# Patient Record
Sex: Male | Born: 1960 | Race: White | Hispanic: Yes | Marital: Married | Smoking: Never smoker
Health system: Southern US, Community
[De-identification: ages and names within clinical notes are randomized; demographics above are authoritative.]

## PROBLEM LIST (undated history)

## (undated) DIAGNOSIS — D239 Other benign neoplasm of skin, unspecified: Secondary | ICD-10-CM

## (undated) DIAGNOSIS — K5792 Diverticulitis of intestine, part unspecified, without perforation or abscess without bleeding: Secondary | ICD-10-CM

## (undated) DIAGNOSIS — Z86010 Personal history of colonic polyps: Secondary | ICD-10-CM

## (undated) DIAGNOSIS — B029 Zoster without complications: Secondary | ICD-10-CM

## (undated) DIAGNOSIS — IMO0001 Reserved for inherently not codable concepts without codable children: Secondary | ICD-10-CM

## (undated) DIAGNOSIS — H3320 Serous retinal detachment, unspecified eye: Secondary | ICD-10-CM

## (undated) HISTORY — DX: Other benign neoplasm of skin, unspecified: D23.9

## (undated) HISTORY — PX: EYE SURGERY: SHX253

## (undated) HISTORY — DX: Serous retinal detachment, unspecified eye: H33.20

## (undated) HISTORY — DX: Diverticulitis of intestine, part unspecified, without perforation or abscess without bleeding: K57.92

## (undated) HISTORY — DX: Personal history of colonic polyps: Z86.010

## (undated) HISTORY — DX: Zoster without complications: B02.9

## (undated) HISTORY — DX: Reserved for inherently not codable concepts without codable children: IMO0001

---

## 2009-08-05 ENCOUNTER — Ambulatory Visit: Admission: RE | Admit: 2009-08-05 | Discharge: 2009-08-05 | Payer: Self-pay | Source: Home / Self Care

## 2010-07-15 ENCOUNTER — Encounter: Payer: Self-pay | Admitting: Internal Medicine

## 2010-07-15 ENCOUNTER — Ambulatory Visit: Payer: Self-pay | Admitting: Internal Medicine

## 2010-07-15 DIAGNOSIS — D239 Other benign neoplasm of skin, unspecified: Secondary | ICD-10-CM | POA: Insufficient documentation

## 2010-07-20 DIAGNOSIS — D239 Other benign neoplasm of skin, unspecified: Secondary | ICD-10-CM

## 2010-07-20 DIAGNOSIS — IMO0001 Reserved for inherently not codable concepts without codable children: Secondary | ICD-10-CM

## 2010-07-20 HISTORY — DX: Other benign neoplasm of skin, unspecified: D23.9

## 2010-07-20 HISTORY — DX: Reserved for inherently not codable concepts without codable children: IMO0001

## 2010-07-23 ENCOUNTER — Ambulatory Visit
Admission: RE | Admit: 2010-07-23 | Discharge: 2010-07-23 | Payer: Self-pay | Source: Home / Self Care | Attending: Internal Medicine | Admitting: Internal Medicine

## 2010-07-23 ENCOUNTER — Other Ambulatory Visit: Payer: Self-pay | Admitting: Internal Medicine

## 2010-07-23 LAB — CBC WITH DIFFERENTIAL/PLATELET
Basophils Absolute: 0 10*3/uL (ref 0.0–0.1)
Basophils Relative: 0.6 % (ref 0.0–3.0)
Eosinophils Absolute: 0.1 10*3/uL (ref 0.0–0.7)
Eosinophils Relative: 1.9 % (ref 0.0–5.0)
HCT: 44.7 % (ref 39.0–52.0)
Hemoglobin: 15.2 g/dL (ref 13.0–17.0)
Lymphocytes Relative: 23.5 % (ref 12.0–46.0)
Lymphs Abs: 1.3 10*3/uL (ref 0.7–4.0)
MCHC: 34.1 g/dL (ref 30.0–36.0)
MCV: 92.3 fl (ref 78.0–100.0)
Monocytes Absolute: 0.5 10*3/uL (ref 0.1–1.0)
Monocytes Relative: 9.2 % (ref 3.0–12.0)
Neutro Abs: 3.6 10*3/uL (ref 1.4–7.7)
Neutrophils Relative %: 64.8 % (ref 43.0–77.0)
Platelets: 130 10*3/uL — ABNORMAL LOW (ref 150.0–400.0)
RBC: 4.84 Mil/uL (ref 4.22–5.81)
RDW: 13.8 % (ref 11.5–14.6)
WBC: 5.6 10*3/uL (ref 4.5–10.5)

## 2010-07-23 LAB — BASIC METABOLIC PANEL
BUN: 21 mg/dL (ref 6–23)
CO2: 29 mEq/L (ref 19–32)
Calcium: 9.6 mg/dL (ref 8.4–10.5)
Chloride: 103 mEq/L (ref 96–112)
Creatinine, Ser: 1.2 mg/dL (ref 0.4–1.5)
GFR: 69.03 mL/min (ref >60.00)
Glucose, Bld: 95 mg/dL (ref 70–99)
Potassium: 4.7 mEq/L (ref 3.5–5.1)
Sodium: 139 mEq/L (ref 135–145)

## 2010-07-23 LAB — LIPID PANEL
Cholesterol: 177 mg/dL (ref 0–200)
HDL: 40.7 mg/dL (ref >39.00)
LDL Cholesterol: 117 mg/dL — ABNORMAL HIGH (ref 0–99)
Total CHOL/HDL Ratio: 4
Triglycerides: 95 mg/dL (ref 0.0–149.0)
VLDL: 19 mg/dL (ref 0.0–40.0)

## 2010-07-23 LAB — TSH: TSH: 2.17 u[IU]/mL (ref 0.35–5.50)

## 2010-07-23 LAB — PSA: PSA: 1.63 ng/mL (ref 0.10–4.00)

## 2010-07-23 LAB — ALT: ALT: 34 U/L (ref 0–53)

## 2010-07-23 LAB — AST: AST: 53 U/L — ABNORMAL HIGH (ref 0–37)

## 2010-07-24 ENCOUNTER — Ambulatory Visit: Admission: RE | Admit: 2010-07-24 | Discharge: 2010-07-24 | Payer: Self-pay | Source: Home / Self Care

## 2010-07-24 DIAGNOSIS — R9430 Abnormal result of cardiovascular function study, unspecified: Secondary | ICD-10-CM | POA: Insufficient documentation

## 2010-08-04 ENCOUNTER — Telehealth (INDEPENDENT_AMBULATORY_CARE_PROVIDER_SITE_OTHER): Payer: Self-pay | Admitting: *Deleted

## 2010-08-05 ENCOUNTER — Ambulatory Visit (HOSPITAL_COMMUNITY)
Admission: RE | Admit: 2010-08-05 | Discharge: 2010-08-05 | Payer: Self-pay | Source: Home / Self Care | Attending: Cardiology | Admitting: Cardiology

## 2010-08-05 ENCOUNTER — Encounter: Payer: Self-pay | Admitting: Cardiology

## 2010-08-05 ENCOUNTER — Encounter: Payer: Self-pay | Admitting: Physician Assistant

## 2010-08-05 ENCOUNTER — Encounter (HOSPITAL_COMMUNITY)
Admission: RE | Admit: 2010-08-05 | Discharge: 2010-08-05 | Payer: Self-pay | Source: Home / Self Care | Attending: Cardiology | Admitting: Cardiology

## 2010-08-05 ENCOUNTER — Ambulatory Visit: Admit: 2010-08-05 | Payer: Self-pay

## 2010-08-12 ENCOUNTER — Encounter: Payer: Self-pay | Admitting: Internal Medicine

## 2010-08-21 NOTE — Assessment & Plan Note (Signed)
Summary: NEW TO EST/KN   Vital Signs:  Patient profile:   50 year old male Height:      68.25 inches Weight:      187.25 pounds BMI:     28.37 Pulse rate:   69 / minute Pulse rhythm:   regular BP sitting:   120 / 80  (left arm) Cuff size:   regular  Vitals Entered By: Army Fossa CMA (July 15, 2010 2:56 PM) CC: New to establish- CPX, not fasting    History of Present Illness: CPX last CPX  ~ 2007, cholesterol has been checked yearly ----> normal, neg stool test before  would like stress test, recently his brother who is very athletic, had an abnormal stress test , follow up by a cath---> 2  stents  Preventive Screening-Counseling & Management  Alcohol-Tobacco     Smoking Status: never  Caffeine-Diet-Exercise     Does Patient Exercise: yes     Times/week: 3      Drug Use:  no.    Current Medications (verified): 1)  None  Allergies (verified): 1)  ! Pcn  Past History:  Family History: Last updated: 07/15/2010 Hypertension-- B  DM--no CAD-- MI GF, 3 uncles ; B  (stent  at age 13) lung cancer-- father (smoker) colon ca--no colon polyps--no prostate ca--no  Social History: Last updated: 07/15/2010 Married 3 children  Never Smoked Alcohol use-yes Drug use-no Regular exercise--yes, runner, regular  diet--regular   Risk Factors: Exercise: yes (07/15/2010)  Risk Factors: Smoking Status: never (07/15/2010)  Past Medical History: duodenal ulcer twice in his 30s  Past Surgical History: Eye Surgery -- x 2   Family History: Hypertension-- B  DM--no CAD-- MI GF, 3 uncles ; B  (stent  at age 54) lung cancer-- father (smoker) colon ca--no colon polyps--no prostate ca--no  Social History: Married 3 children  Never Smoked Alcohol use-yes Drug use-no Regular exercise--yes, runner, regular  diet--regular  Smoking Status:  never Drug Use:  no Does Patient Exercise:  yes  Review of Systems CV:  Denies chest pain or discomfort and  swelling of feet. Resp:  Denies cough and shortness of breath. GI:  Denies bloody stools, diarrhea, nausea, and vomiting. GU:  Denies hematuria, urinary frequency, and urinary hesitancy.  Physical Exam  General:  alert, well-developed, and well-nourished.   Neck:  no masses, no thyromegaly, and normal carotid upstroke.   Lungs:  normal respiratory effort, no intercostal retractions, no accessory muscle use, and normal breath sounds.   Heart:  normal rate, regular rhythm, and no murmur.   Abdomen:  soft, non-tender, no distention, no masses, no guarding, and no rigidity.   Rectal:  No external abnormalities noted. Normal sphincter tone. No rectal masses or tenderness. hemocult neg Prostate:  Prostate gland firm and smooth, no enlargement, nodularity, tenderness, mass, asymmetry or induration. Extremities:  no pretibial edema bilaterally  Psych:  Cognition and judgment appear intact. Alert and cooperative with normal attention span and concentration.    Impression & Recommendations:  Problem # 1:  ROUTINE GENERAL MEDICAL EXAM@HEALTH  CARE FACL (ICD-V70.0) Td  ~ 2007 flu shot-- recommended if so desire  labs ++ FH CAD--- refer to stress test multiple moles -- one of them bicolor at the lower back, refer to dermatology   continue w/  exercise diet discussed   Orders: EKG w/ Interpretation (93000) Cardiology Referral (Cardiology)  Other Orders: Dermatology Referral (Derma)  Patient Instructions: 1)  came back fasting: 2)  FLP PSA BMP CBC AST ALT  TSH----dx V70 3)  Please schedule a follow-up appointment in 1 year.    Orders Added: 1)  EKG w/ Interpretation [93000] 2)  Cardiology Referral [Cardiology] 3)  Dermatology Referral [Derma] 4)  New Patient 40-64 years [99386]   Immunization History:  Tetanus/Td Immunization History:    Tetanus/Td:  historical (07/20/2005)   Immunization History:  Tetanus/Td Immunization History:    Tetanus/Td:  Historical  (07/20/2005)   Risk Factors:  Tobacco use:  never Drug use:  no Exercise:  yes    Times per week:  3

## 2010-08-21 NOTE — Progress Notes (Signed)
Summary: Stress Echo pre procedure  Phone Note Outgoing Call Call back at [facility] Phone [phone]   Call placed by: Rea College, CMA,  August 04, 2010 3:40 PM Call placed to: Patient Summary of Call: Called and left message for stress echo on Tuesday, 08/05/10.

## 2010-09-04 NOTE — Consult Note (Signed)
Summary: [facility]  [facility]   Imported By: Maryln Gottron 08/26/2010 09:39:37  _____________________________________________________________________  External Attachment:    Type:   Image     Comment:   External Document

## 2010-12-26 ENCOUNTER — Ambulatory Visit: Payer: Self-pay | Admitting: Internal Medicine

## 2010-12-26 ENCOUNTER — Encounter: Payer: Self-pay | Admitting: Internal Medicine

## 2010-12-26 ENCOUNTER — Ambulatory Visit (INDEPENDENT_AMBULATORY_CARE_PROVIDER_SITE_OTHER): Payer: BC Managed Care – PPO | Admitting: Internal Medicine

## 2010-12-26 DIAGNOSIS — M549 Dorsalgia, unspecified: Secondary | ICD-10-CM | POA: Insufficient documentation

## 2010-12-26 MED ORDER — CYCLOBENZAPRINE HCL 10 MG PO TABS: 10.0000 mg | ORAL_TABLET | Freq: Every evening | ORAL | Status: DC | PRN

## 2010-12-26 MED ORDER — PREDNISONE 10 MG PO TABS: ORAL_TABLET | ORAL | Status: AC

## 2010-12-26 MED ORDER — HYDROCODONE-ACETAMINOPHEN 5-500 MG PO TABS: 1.0000 | ORAL_TABLET | ORAL | Status: DC | PRN

## 2010-12-26 NOTE — Patient Instructions (Signed)
Rest, warm compress Prednisone for few days Tylenol 500 mg 2 tabs every 6 hours as needed for pain Flexeril at bedtime (muscle relaxant) If the pain is severe : hydrocodone, will cause drowsiness  Physical Therapy @ [facility] [phone] Call if no better

## 2010-12-26 NOTE — Assessment & Plan Note (Signed)
Several years history of on and off back pain, most recent exacerbation started 2 weeks ago. Neurological exam normal. Short-term treatment: Prednisone, Flexeril, Tylenol and Vicodin. Because he is a history of peptic ulcer disease, I prefer to avoid Celebrex. I also recommend to stop Motrin. Long-term solution, I think he will benefit from physical therapy and strengthening on his core muscles.Due to to his heavy schedule I will ask him to call them directly. Possible drowsiness with Flexeril and Vicodin discussed

## 2010-12-26 NOTE — Progress Notes (Signed)
  Subjective:    Patient ID: Gary Huff, male    DOB: 25-Mar-1961, 50 y.o.   MRN: [id number]  HPI  Long history of on-and-off back pain since he was a teenager. At some point years ago he had an MRI ---> was okay. Vioxx use to help him a lot. In the last few years, he has seen a chiropractor on and off, he has occasionally a back adjustment. This particular episode started 3 weeks ago, pain is at the bilateral lower back without radiation. 3 days ago after a soccer match he felt  worse. Went to see his chiropractor, he received an adjustment yesterday and was recommended Motrin and ice. Motrin helped some.  Past Medical History  Diagnosis Date  . Duodenal ulcer     twice in his 30s  . Normal stress echocardiogram 07/2010    no ischemia; normal LVF  . Dysplastic nevus 07-2010    Dr Terri Piedra   Past Surgical History  Procedure Date  . Eye surgery     x 2     Review of Systems No fevers No bowel or bladder incontinence, nor rash. No lower extremity paresthesias. No abdominal pain or dysuria      Objective:   Physical Exam Alert, oriented, no apparent distress except for mild discomfort when he laid down in the table to be examined. Abdomen soft and nontender. Back, not tender to palpation. Neurological exam strength in the lower extremities is normal DTRs normal except for symmetric decrease in the ankle jerk. Straight leg test negative. Lower extremity without edema.       Assessment & Plan:

## 2010-12-29 ENCOUNTER — Ambulatory Visit: Payer: Self-pay | Admitting: Internal Medicine

## 2011-07-03 ENCOUNTER — Ambulatory Visit (INDEPENDENT_AMBULATORY_CARE_PROVIDER_SITE_OTHER): Payer: BC Managed Care – PPO | Admitting: Internal Medicine

## 2011-07-03 VITALS — BP 118/70 | HR 61 | Temp 98.0°F | Ht 69.5 in | Wt 184.0 lb

## 2011-07-03 DIAGNOSIS — J069 Acute upper respiratory infection, unspecified: Secondary | ICD-10-CM

## 2011-07-03 MED ORDER — AZITHROMYCIN 250 MG PO TABS: ORAL_TABLET | ORAL | Status: AC

## 2011-07-03 NOTE — Patient Instructions (Signed)
Rest, fluids , tylenol For cough, take Mucinex DM twice a day as needed  For congestion use astepro 2 sprays on each side of the nose at night until samples are gone A;s you can use Sudafed (pseudoephedrine) behind the counter 30 mg every  6 hours as needed If no better in the next 2 ,3 days start the the antibiotic as prescribed ----> zithromax  Call if no better in few days Call anytime if the symptoms are severe

## 2011-07-03 NOTE — Progress Notes (Signed)
  Subjective:    Patient ID: Gary Huff, male    DOB: 1961-06-06, 50 y.o.   MRN: [id number]  HPI Sx started 1 weeks ago: sinus congestion and PND, OTC helping little. X the last day, "sx going to the chest"  Past Medical History: duodenal ulcer twice in his 30s Normal stress echocardiogram 07/2010 , no ischemia; normal LVF  Dysplastic nevus , 07-2010 , Dr Terri Piedra   Past Surgical History: Eye Surgery -- x 2   Family History: Hypertension-- B  DM--no CAD-- MI GF, 3 uncles ; B  (stent  at age 68) lung cancer-- father (smoker) colon ca--no colon polyps--no prostate ca--no  Social History: Married 3 children  Never Smoked Alcohol use-yes Drug use-no Regular exercise--yes, runner, regular  diet--regular  Smoking Status:  never Drug Use:  no Does Patient Exercise:  yes  Review of Systems no F/C, no N-V-Dno myalgias Some cough ++ PND, clear in color       Objective:   Physical Exam  Constitutional: He is oriented to person, place, and time. He appears well-developed and well-nourished. No distress.  HENT:  Head: Normocephalic and atraumatic.  Right Ear: External ear normal.  Left Ear: External ear normal.  Mouth/Throat: No oropharyngeal exudate.       Face symetric , NTTP, nose quite congested   Cardiovascular: Normal rate, regular rhythm and normal heart sounds.   No murmur heard. Pulmonary/Chest: Effort normal and breath sounds normal. No respiratory distress. He has no wheezes. He has no rales.  Musculoskeletal: He exhibits no edema.  Neurological: He is alert and oriented to person, place, and time.  Skin: He is not diaphoretic.      Assessment & Plan:  URI See instructions

## 2011-07-23 ENCOUNTER — Ambulatory Visit (INDEPENDENT_AMBULATORY_CARE_PROVIDER_SITE_OTHER): Payer: BC Managed Care – PPO | Admitting: Internal Medicine

## 2011-07-23 ENCOUNTER — Encounter: Payer: Self-pay | Admitting: Internal Medicine

## 2011-07-23 DIAGNOSIS — Z0000 Encounter for general adult medical examination without abnormal findings: Secondary | ICD-10-CM

## 2011-07-23 LAB — CBC WITH DIFFERENTIAL/PLATELET
Eosinophils Relative: 1.6 % (ref 0.0–5.0)
HCT: 44.6 % (ref 39.0–52.0)
Lymphs Abs: 1.3 10*3/uL (ref 0.7–4.0)
Monocytes Relative: 9.8 % (ref 3.0–12.0)
Platelets: 153 10*3/uL (ref 150.0–400.0)
WBC: 4.6 10*3/uL (ref 4.5–10.5)

## 2011-07-23 LAB — COMPREHENSIVE METABOLIC PANEL
AST: 31 U/L (ref 0–37)
Alkaline Phosphatase: 44 U/L (ref 39–117)
BUN: 18 mg/dL (ref 6–23)
Calcium: 9.7 mg/dL (ref 8.4–10.5)
Creatinine, Ser: 1 mg/dL (ref 0.4–1.5)
Total Bilirubin: 1 mg/dL (ref 0.3–1.2)

## 2011-07-23 LAB — PSA: PSA: 1.66 ng/mL (ref 0.10–4.00)

## 2011-07-23 NOTE — Assessment & Plan Note (Addendum)
Td ~ 2007 Had a flu shot labs ++ FH CAD---> NMR  multiple moles -- rec to see Dr Terri Piedra at least anually Discussed cscope v.iFOB, (risk-benefits), decide to go for a Cscope---> referral continue w/  exercise diet discussed

## 2011-07-23 NOTE — Progress Notes (Signed)
  Subjective:    Patient ID: Gary Huff, male    DOB: 1960/11/26, 51 y.o.   MRN: [id number]  HPI CPX  Past Medical History: duodenal ulcer twice in his 30s Dysplastic nevus, 07-2010, Dr. 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  Respiratory: Negative for cough and shortness of breath.   Cardiovascular: Negative for chest pain and leg swelling.  Gastrointestinal: Negative for abdominal pain and blood in stool.  Genitourinary: Negative for dysuria and hematuria.       Objective:   Physical Exam  Constitutional: He is oriented to person, place, and time. He appears well-developed and well-nourished. No distress.  HENT:  Head: Normocephalic and atraumatic.  Neck: No thyromegaly present.  Cardiovascular: Normal rate, regular rhythm and normal heart sounds.   No murmur heard. Pulmonary/Chest: Effort normal and breath sounds normal. No respiratory distress. He has no wheezes. He has no rales.  Abdominal: Soft. Bowel sounds are normal. He exhibits no distension. There is no tenderness. There is no rebound and no guarding.  Genitourinary: Rectum normal and prostate normal. Guaiac negative stool.  Musculoskeletal: He exhibits no edema.  Lymphadenopathy:    He has no cervical adenopathy.  Neurological: He is alert and oriented to person, place, and time.  Skin: He is not diaphoretic.  Psychiatric: He has a normal mood and affect. His behavior is normal. Judgment and thought content normal.       Assessment & Plan:

## 2011-07-24 ENCOUNTER — Encounter: Payer: Self-pay | Admitting: Internal Medicine

## 2011-07-24 LAB — NMR, LIPOPROFILE

## 2011-08-05 ENCOUNTER — Ambulatory Visit: Payer: BC Managed Care – PPO | Admitting: *Deleted

## 2011-08-05 NOTE — Progress Notes (Signed)
Spoke with pt's wife at 1500- she gave his cell phone number.  Message left on cell for pt to call office back to reschedule PV; otherwise colonoscopy for 08-19-11 would be canceled   No call from pt- letter sent and colonoscopy for 08-19-11 cancelled

## 2011-08-07 ENCOUNTER — Telehealth: Payer: Self-pay | Admitting: Internal Medicine

## 2011-08-07 NOTE — Telephone Encounter (Signed)
Labs reviewed, all normal.  NMR: Total cholesterol 179, HDL 53, LDL 105. LDL/P. borderline high at 1610 Given the strong family history of heart disease I would like to see the LDL below 100 and LDL/P lower LMOM

## 2011-08-18 MED ORDER — ATORVASTATIN CALCIUM 10 MG PO TABS: 10.0000 mg | ORAL_TABLET | Freq: Every day | ORAL | Status: DC

## 2011-08-18 NOTE — Telephone Encounter (Signed)
I discussed the results of his labs  at length during a phone conversation a few days ago. sending a prescription for Lipitor to his pharmacy Also recommended to have FLP, AST, ALT in 6 weeks, dx hyperlipidemia

## 2011-08-19 ENCOUNTER — Encounter: Payer: BC Managed Care – PPO | Admitting: Internal Medicine

## 2011-08-21 ENCOUNTER — Encounter: Payer: Self-pay | Admitting: Internal Medicine

## 2012-04-23 ENCOUNTER — Other Ambulatory Visit: Payer: Self-pay | Admitting: Internal Medicine

## 2012-04-25 NOTE — Telephone Encounter (Signed)
Refill done.  

## 2012-05-12 ENCOUNTER — Ambulatory Visit (HOSPITAL_BASED_OUTPATIENT_CLINIC_OR_DEPARTMENT_OTHER)
Admission: RE | Admit: 2012-05-12 | Discharge: 2012-05-12 | Disposition: A | Discharge status: Home / Self Care | Payer: BC Managed Care – PPO | Source: Ambulatory Visit | Attending: Internal Medicine | Admitting: Internal Medicine

## 2012-05-12 ENCOUNTER — Encounter (HOSPITAL_BASED_OUTPATIENT_CLINIC_OR_DEPARTMENT_OTHER): Payer: Self-pay

## 2012-05-12 ENCOUNTER — Ambulatory Visit (INDEPENDENT_AMBULATORY_CARE_PROVIDER_SITE_OTHER): Payer: BC Managed Care – PPO | Admitting: Internal Medicine

## 2012-05-12 ENCOUNTER — Encounter: Payer: Self-pay | Admitting: Internal Medicine

## 2012-05-12 VITALS — BP 130/80 | HR 71 | Temp 98.1°F | Wt 185.0 lb

## 2012-05-12 DIAGNOSIS — R1030 Lower abdominal pain, unspecified: Secondary | ICD-10-CM

## 2012-05-12 DIAGNOSIS — K5732 Diverticulitis of large intestine without perforation or abscess without bleeding: Secondary | ICD-10-CM

## 2012-05-12 DIAGNOSIS — K8020 Calculus of gallbladder without cholecystitis without obstruction: Secondary | ICD-10-CM | POA: Insufficient documentation

## 2012-05-12 DIAGNOSIS — R109 Unspecified abdominal pain: Secondary | ICD-10-CM

## 2012-05-12 DIAGNOSIS — R1032 Left lower quadrant pain: Secondary | ICD-10-CM | POA: Insufficient documentation

## 2012-05-12 DIAGNOSIS — K5792 Diverticulitis of intestine, part unspecified, without perforation or abscess without bleeding: Secondary | ICD-10-CM

## 2012-05-12 LAB — POCT URINALYSIS DIPSTICK
Blood, UA: NEGATIVE
Protein, UA: NEGATIVE
Spec Grav, UA: 1.015
Urobilinogen, UA: 0.2

## 2012-05-12 LAB — CBC WITH DIFFERENTIAL/PLATELET
Basophils Relative: 0.2 % (ref 0.0–3.0)
Eosinophils Absolute: 0.1 10*3/uL (ref 0.0–0.7)
HCT: 46.6 % (ref 39.0–52.0)
Hemoglobin: 15.6 g/dL (ref 13.0–17.0)
Lymphs Abs: 1.2 10*3/uL (ref 0.7–4.0)
MCHC: 33.4 g/dL (ref 30.0–36.0)
MCV: 92.4 fl (ref 78.0–100.0)
Monocytes Absolute: 1 10*3/uL (ref 0.1–1.0)
Neutro Abs: 8.8 10*3/uL — ABNORMAL HIGH (ref 1.4–7.7)
RBC: 5.05 Mil/uL (ref 4.22–5.81)

## 2012-05-12 LAB — BASIC METABOLIC PANEL
CO2: 28 mEq/L (ref 19–32)
Chloride: 103 mEq/L (ref 96–112)
Creatinine, Ser: 1.1 mg/dL (ref 0.4–1.5)
Sodium: 138 mEq/L (ref 135–145)

## 2012-05-12 MED ORDER — CIPROFLOXACIN HCL 500 MG PO TABS: 500.0000 mg | ORAL_TABLET | Freq: Two times a day (BID) | ORAL | Status: DC

## 2012-05-12 MED ORDER — METRONIDAZOLE 500 MG PO TABS: 500.0000 mg | ORAL_TABLET | Freq: Three times a day (TID) | ORAL | Status: DC

## 2012-05-12 MED ORDER — HYOSCYAMINE SULFATE 0.125 MG PO TABS: 0.1250 mg | ORAL_TABLET | ORAL | Status: DC | PRN

## 2012-05-12 MED ORDER — IOHEXOL 300 MG/ML  SOLN
100.0000 mL | Freq: Once | INTRAMUSCULAR | Status: AC | PRN
  Administered 2012-05-12: 100 mL via INTRAVENOUS

## 2012-05-12 NOTE — Assessment & Plan Note (Addendum)
51 year old gentleman with one-day history of lower abdominal pain, never had a colonoscopy or abdominal surgery. He is certainly tender at the left lower quadrant. Urinalysis negative Most likely, he has diverticulitis, early appendicitis? Plan: BMP, CBC, CT. Further advice for results Addendum CT confirmed diverticulitis, plan: Flagyl, Cipro, Levsin when necessary. Will arrange a colonoscopy to be done in about 2 months once he is completely better Patient knows to call if noimprovement soon or if  develops more abdominal pain or fever He also has cholelithiasis, an incidental finding. Patient aware.

## 2012-05-12 NOTE — Patient Instructions (Addendum)
Get a chest XR at [facility], corner of [address] (10 minutes form here); they are open 24/7 [address]  [place], [place] [address] [phone]

## 2012-05-12 NOTE — Progress Notes (Signed)
  Subjective:    Patient ID: Gary Huff, male    DOB: 1960/11/20, 51 y.o.   MRN: [id number]  HPI Acute visit One-day history of lower abdominal pain, worse in the suprapubic area and left side. The pain is steady, sometimes crampy. He is having normal bowel movements, last BM today. Denies any blood in the stools or diarrhea. Has not taken any recent antibiotics He took over-the-counter gas medication without much relief.  Past Medical History: duodenal ulcer twice in his 30s Dysplastic nevus, 07-2010, Dr. 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 No dysuria or gross hematuria.     Objective:   Physical Exam  General -- alert, well-developed. No apparent distress.  HEENT -- not pale or jaundice Lungs -- normal respiratory effort, no intercostal retractions, no accessory muscle use, and normal breath sounds.   Heart-- normal rate, regular rhythm, no murmur, and no gallop.   Abdomen-- bowel sounds, not distended, definitely tender at the left lower quadrant without mass or rebound. Mild tenderness at the suprapubic area, no tender at the right lower quadrant. Extremities-- no pretibial edema bilaterally  Neurologic-- alert & oriented X3 and strength normal in all extremities. Psych-- Cognition and judgment appear intact. Alert and cooperative with normal attention span and concentration.  not anxious appearing and not depressed appearing.         Assessment & Plan:

## 2012-05-13 ENCOUNTER — Encounter: Payer: Self-pay | Admitting: Internal Medicine

## 2012-06-07 ENCOUNTER — Other Ambulatory Visit: Payer: Self-pay | Admitting: Internal Medicine

## 2012-06-07 NOTE — Telephone Encounter (Signed)
Pharmacy confirmed pt still has refills left on file. Refill request sent in error.  

## 2012-07-18 ENCOUNTER — Encounter: Payer: BC Managed Care – PPO | Admitting: Internal Medicine

## 2012-09-30 ENCOUNTER — Ambulatory Visit (INDEPENDENT_AMBULATORY_CARE_PROVIDER_SITE_OTHER): Payer: BC Managed Care – PPO | Admitting: Internal Medicine

## 2012-09-30 VITALS — BP 120/82 | HR 59 | Temp 97.7°F | Wt 189.0 lb

## 2012-09-30 DIAGNOSIS — J209 Acute bronchitis, unspecified: Secondary | ICD-10-CM

## 2012-09-30 MED ORDER — AZITHROMYCIN 250 MG PO TABS: ORAL_TABLET | ORAL | Status: DC

## 2012-09-30 NOTE — Patient Instructions (Addendum)
Rest, fluids , tylenol For cough, take Mucinex DM twice a day as needed   Take the antibiotic as prescribed  (zithromax ) Call if no better in few days Call anytime if the symptoms are severe  

## 2012-09-30 NOTE — Progress Notes (Signed)
  Subjective:    Patient ID: Gary Huff, male    DOB: 03/29/1961, 52 y.o.   MRN: [id number]  HPI Acute visit One-week history of not feeling well, sore throat, hoarseness, symptoms are on and off, worse at night. For the last couple of days he is also coughing green sputum. Also would like to reschedule a colonoscopy   Past Medical History  Diagnosis Date  . Duodenal ulcer     twice in his 30s  . Normal stress echocardiogram 07/2010    no ischemia; normal LVF  . Dysplastic nevus 07-2010    Dr Terri Piedra   Past Surgical History  Procedure Laterality Date  . Eye surgery      x 2   Family History  Problem Relation Age of Onset  . Hypertension Brother   . Diabetes Neg Hx   . Coronary artery disease      GF, 3 uncles, Brother  (stent  at age 45)  . Lung cancer Father     smoker  . Colon cancer Neg Hx   . Colon polyps Neg Hx   . 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 No nausea, vomiting, diarrhea. No myalgias.     Objective:   Physical Exam General -- alert, well-developed, no apparent distress, afebrile.   HEENT -- TMs normal, throat w/o redness, face symmetric and not tender to palpation, nose is slightly congested  Lungs -- normal respiratory effort, no intercostal retractions, no accessory muscle use, and normal breath sounds.   Heart-- normal rate, regular rhythm, no murmur, and no gallop.   Extremities-- no pretibial edema bilaterally Psych-- Cognition and judgment appear intact. Alert and cooperative with normal attention span and concentration.  not anxious appearing and not depressed appearing.       Assessment & Plan:   Mild bronchitis, Symptoms consistent with mild bronchitis, patient has several trips planned and won't be in town  consequently I like to cover him with Zithromax. See instructions  History of diverticulitis, desires to reschedule the colonoscopy, note sent to my scheduler.

## 2012-10-01 ENCOUNTER — Encounter: Payer: Self-pay | Admitting: Internal Medicine

## 2012-11-02 ENCOUNTER — Encounter: Payer: Self-pay | Admitting: Internal Medicine

## 2012-12-13 ENCOUNTER — Telehealth: Payer: Self-pay | Admitting: *Deleted

## 2012-12-13 MED ORDER — ATORVASTATIN CALCIUM 10 MG PO TABS: ORAL_TABLET | ORAL | Status: DC

## 2012-12-13 NOTE — Telephone Encounter (Signed)
Ok to call # 30 and 1 RF. He told me is trying to get a physical schedule

## 2012-12-13 NOTE — Telephone Encounter (Signed)
Last OV 09-30-12 acute, last filled 04-23-12 #30 3, last lipid 07-24-11

## 2012-12-13 NOTE — Telephone Encounter (Signed)
Refill done.  

## 2012-12-14 ENCOUNTER — Other Ambulatory Visit: Payer: Self-pay | Admitting: Internal Medicine

## 2012-12-14 NOTE — Telephone Encounter (Signed)
Spoke to pharmacy. Pt still has refills left on file. Refill request sent in error.

## 2012-12-26 ENCOUNTER — Encounter: Payer: Self-pay | Admitting: Internal Medicine

## 2012-12-26 ENCOUNTER — Ambulatory Visit (AMBULATORY_SURGERY_CENTER): Payer: BC Managed Care – PPO | Admitting: *Deleted

## 2012-12-26 VITALS — Ht 69.0 in | Wt 188.0 lb

## 2012-12-26 DIAGNOSIS — Z1211 Encounter for screening for malignant neoplasm of colon: Secondary | ICD-10-CM

## 2012-12-26 MED ORDER — NA SULFATE-K SULFATE-MG SULF 17.5-3.13-1.6 GM/177ML PO SOLN: 1.0000 | Freq: Once | ORAL | Status: DC

## 2012-12-26 NOTE — Progress Notes (Signed)
No egg or soy allergy. ewm No home 02 use. ewm No previous colon per pt. ewm emmi sent to pt's email. ewm

## 2013-01-03 ENCOUNTER — Ambulatory Visit (AMBULATORY_SURGERY_CENTER): Payer: BC Managed Care – PPO | Admitting: Internal Medicine

## 2013-01-03 ENCOUNTER — Encounter: Payer: BC Managed Care – PPO | Admitting: Internal Medicine

## 2013-01-03 ENCOUNTER — Encounter: Payer: Self-pay | Admitting: Internal Medicine

## 2013-01-03 VITALS — BP 104/72 | HR 53 | Temp 98.0°F | Resp 17 | Ht 69.0 in | Wt 188.0 lb

## 2013-01-03 DIAGNOSIS — D126 Benign neoplasm of colon, unspecified: Secondary | ICD-10-CM

## 2013-01-03 DIAGNOSIS — K5730 Diverticulosis of large intestine without perforation or abscess without bleeding: Secondary | ICD-10-CM

## 2013-01-03 DIAGNOSIS — Z1211 Encounter for screening for malignant neoplasm of colon: Secondary | ICD-10-CM

## 2013-01-03 MED ORDER — SODIUM CHLORIDE 0.9 % IV SOLN: 500.0000 mL | INTRAVENOUS | Status: DC

## 2013-01-03 NOTE — Progress Notes (Signed)
Called to room to assist during endoscopic procedure.  Patient ID and intended procedure confirmed with present staff. Received instructions for my participation in the procedure from the performing physician.  

## 2013-01-03 NOTE — Progress Notes (Signed)
Patient did not have preoperative order for IV antibiotic SSI prophylaxis. (G8918)  Patient did not experience any of the following events: a burn prior to discharge; a fall within the facility; wrong site/side/patient/procedure/implant event; or a hospital transfer or hospital admission upon discharge from the facility. (G8907)  

## 2013-01-03 NOTE — Op Note (Signed)
[facility] [address] [place], [address]   COLONOSCOPY PROCEDURE REPORT  PATIENT: Gary Huff, Gary Huff  MR#: [id number] BIRTHDATE: 02-Oct-1960 , 51  yrs. old GENDER: Male ENDOSCOPIST: Iva Boop, MD, [facility] REFERRED XB:JYNW Drue Novel, M.D. PROCEDURE DATE:  01/03/2013 PROCEDURE:   Colonoscopy with biopsy ASA CLASS:   Class I INDICATIONS:average risk screening and first colonoscopy. MEDICATIONS: propofol (Diprivan) 400mg  IV, MAC sedation, administered by CRNA, and These medications were titrated to patient response per physician's verbal order  DESCRIPTION OF PROCEDURE:   After the risks benefits and alternatives of the procedure were thoroughly explained, informed consent was obtained.  A digital rectal exam revealed no abnormalities of the rectum, A digital rectal exam revealed no prostatic nodules, and A digital rectal exam revealed the prostate was not enlarged.   The LB [id number]  endoscope was introduced through the anus and advanced to the cecum, which was identified by both the appendix and ileocecal valve. No adverse events experienced.   The quality of the prep was excellent using Suprep  The instrument was then slowly withdrawn as the colon was fully examined.      COLON FINDINGS: Two diminutive sessile polyps were found in the ascending colon and transverse colon.  A polypectomy was performed with cold forceps.  The resection was complete and the polyp tissue was completely retrieved.   Moderate diverticulosis was noted in the descending colon and sigmoid colon.   The colon mucosa was otherwise normal.   A right colon retroflexion was performed. Retroflexed views revealed no abnormalities. The time to cecum=2 minutes 15 seconds.  Withdrawal time=16 minutes 52 seconds.  The scope was withdrawn and the procedure completed. COMPLICATIONS: There were no complications.  ENDOSCOPIC IMPRESSION: 1.   Two diminutive sessile polyps were found in the  ascending colon and transverse colon; polypectomy was performed with cold forceps 2.   Moderate diverticulosis was noted in the descending colon and sigmoid colon 3.   The colon mucosa was otherwise normal - excellent prep  RECOMMENDATIONS: Timing of repeat colonoscopy will be determined by pathology findings.   eSigned:  Iva Boop, MD, [facility] 01/03/2013 12:11 PM   cc: Willow Ora, MD and The Patient

## 2013-01-03 NOTE — Patient Instructions (Addendum)
I found and removed two very small polyps today. You also have diverticulosis.  I will let you know pathology results and when to have another routine colonoscopy by mail.  I appreciate the opportunity to care for you. Iva Boop, MD, FACG   YOU HAD AN ENDOSCOPIC PROCEDURE TODAY AT THE [facility]: Refer to the procedure report that was given to you for any specific questions about what was found during the examination.  If the procedure report does not answer your questions, please call your gastroenterologist to clarify.  If you requested that your care partner not be given the details of your procedure findings, then the procedure report has been included in a sealed envelope for you to review at your convenience later.  YOU SHOULD EXPECT: Some feelings of bloating in the abdomen. Passage of more gas than usual.  Walking can help get rid of the air that was put into your GI tract during the procedure and reduce the bloating. If you had a lower endoscopy (such as a colonoscopy or flexible sigmoidoscopy) you may notice spotting of blood in your stool or on the toilet paper. If you underwent a bowel prep for your procedure, then you may not have a normal bowel movement for a few days.  DIET: Your first meal following the procedure should be a light meal and then it is ok to progress to your normal diet.  A half-sandwich or bowl of soup is an example of a good first meal.  Heavy or fried foods are harder to digest and may make you feel nauseous or bloated.  Likewise meals heavy in dairy and vegetables can cause extra gas to form and this can also increase the bloating.  Drink plenty of fluids but you should avoid alcoholic beverages for 24 hours.  ACTIVITY: Your care partner should take you home directly after the procedure.  You should plan to take it easy, moving slowly for the rest of the day.  You can resume normal activity the day after the procedure however you should NOT DRIVE  or use heavy machinery for 24 hours (because of the sedation medicines used during the test).    SYMPTOMS TO REPORT IMMEDIATELY: A gastroenterologist can be reached at any hour.  During normal business hours, 8:30 AM to 5:00 PM Monday through Friday, call [phone].  After hours and on weekends, please call the GI answering service at [phone] who will take a message and have the physician on call contact you.   Following lower endoscopy (colonoscopy or flexible sigmoidoscopy):  Excessive amounts of blood in the stool  Significant tenderness or worsening of abdominal pains  Swelling of the abdomen that is new, acute  Fever of 100F or higher   FOLLOW UP: If any biopsies were taken you will be contacted by phone or by letter within the next 1-3 weeks.  Call your gastroenterologist if you have not heard about the biopsies in 3 weeks.  Our staff will call the home number listed on your records the next business day following your procedure to check on you and address any questions or concerns that you may have at that time regarding the information given to you following your procedure. This is a courtesy call and so if there is no answer at the home number and we have not heard from you through the emergency physician on call, we will assume that you have returned to your regular daily activities without incident.  SIGNATURES/CONFIDENTIALITY: You  and/or your care partner have signed paperwork which will be entered into your electronic medical record.  These signatures attest to the fact that that the information above on your After Visit Summary has been reviewed and is understood.  Full responsibility of the confidentiality of this discharge information lies with you and/or your care-partner.

## 2013-01-04 ENCOUNTER — Telehealth: Payer: Self-pay | Admitting: *Deleted

## 2013-01-04 NOTE — Telephone Encounter (Signed)
  Follow up Call-  Call back number 01/03/2013  Post procedure Call Back phone  # [phone]  Permission to leave phone message Yes     Patient questions:  Do you have a fever, pain , or abdominal swelling? no Pain Score  0 *  Have you tolerated food without any problems? yes  Have you been able to return to your normal activities? yes  Do you have any questions about your discharge instructions: Diet   no Medications  no Follow up visit  no  Do you have questions or concerns about your Care? no  Actions: * If pain score is 4 or above: No action needed, pain <4.

## 2013-01-04 NOTE — Telephone Encounter (Signed)
No answer message left for the patient. 

## 2013-01-05 ENCOUNTER — Ambulatory Visit (INDEPENDENT_AMBULATORY_CARE_PROVIDER_SITE_OTHER): Payer: BC Managed Care – PPO | Admitting: Internal Medicine

## 2013-01-05 ENCOUNTER — Encounter: Payer: Self-pay | Admitting: Internal Medicine

## 2013-01-05 VITALS — BP 120/80 | HR 56 | Temp 98.2°F | Ht 68.5 in | Wt 184.0 lb

## 2013-01-05 DIAGNOSIS — Z0000 Encounter for general adult medical examination without abnormal findings: Secondary | ICD-10-CM

## 2013-01-05 LAB — TSH: TSH: 1.58 u[IU]/mL (ref 0.35–5.50)

## 2013-01-05 LAB — COMPREHENSIVE METABOLIC PANEL
ALT: 33 U/L (ref 0–53)
AST: 26 U/L (ref 0–37)
Albumin: 4.7 g/dL (ref 3.5–5.2)
Alkaline Phosphatase: 41 U/L (ref 39–117)
BUN: 19 mg/dL (ref 6–23)
Chloride: 105 mEq/L (ref 96–112)
Potassium: 4.3 mEq/L (ref 3.5–5.1)

## 2013-01-05 LAB — LIPID PANEL
HDL: 48.2 mg/dL (ref >39.00)
LDL Cholesterol: 60 mg/dL (ref 0–99)
Total CHOL/HDL Ratio: 3
Triglycerides: 76 mg/dL (ref 0.0–149.0)

## 2013-01-05 NOTE — Progress Notes (Signed)
  Subjective:    Patient ID: Gary Huff, male    DOB: [birth year], 52 y.o.   MRN: [id number]  HPI CPX  Past Medical History  Diagnosis Date  . Duodenal ulcer     twice in his 30s  . Normal stress echocardiogram 07/2010    no ischemia; normal LVF  . Dysplastic nevus 07-2010    Dr Terri Piedra, sees derm regularly  . Diverticulitis    Past Surgical History  Procedure Laterality Date  . Eye surgery      x 2   History   Social History  . Marital Status: Married    Spouse Name: N/A    Number of Children: 3  . Years of Education: N/A   Occupational History  . [employer]    Social History Main Topics  . Smoking status: Never Smoker   . Smokeless tobacco: Never Used  . Alcohol Use: 2.4 oz/week    4 Glasses of wine per week     Comment: socially  . Drug Use: No  . Sexually Active: Not on file   Other Topics Concern  . 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 Hypertension Brother   . Diabetes Neg Hx   . Colon cancer Neg Hx   . Colon polyps Neg Hx   . Prostate cancer Neg Hx   . Rectal cancer Neg Hx   . Stomach cancer Neg Hx   . Coronary artery disease      GF, 3 uncles, Brother  (stent  at age 65)  . Lung cancer Father     smoker    Review of Systems Regular exercise--yes, runner , bike diet--regular   No chest pain or shortness of breath Good compliance w/ lipitor  no apparent side effects No  nausea, vomiting, diarrhea blood in the stools No dysuria or gross hematuria    Objective:   Physical Exam BP 120/80  Pulse 56  Temp(Src) 98.2 F (36.8 C) (Oral)  Ht 5' 8.5" (1.74 m)  Wt 184 lb (83.462 kg)  BMI 27.57 kg/m2  SpO2 97%  General -- alert, well-developed, NAD Neck --no thyromegaly Lungs -- normal respiratory effort, no intercostal retractions, no accessory muscle use, and normal breath sounds.   Heart-- normal rate, regular  rhythm, no murmur, and no gallop.   Abdomen--soft, non-tender, no distention, no masses, no HSM, no guarding, and no rigidity.   Extremities-- no pretibial edema bilaterally Rectal-- No external abnormalities noted. Normal sphincter tone. No rectal masses or tenderness. Brown stool Prostate:  Prostate gland firm and smooth, no enlargement, nodularity, tenderness, mass, asymmetry or induration. Neurologic-- alert & oriented X3 and strength normal in all extremities. Psych-- Cognition and judgment appear intact. Alert and cooperative with normal attention span and concentration.  not anxious appearing and not depressed appearing.       Assessment & Plan:

## 2013-01-05 NOTE — Patient Instructions (Signed)
Get the XR at [facility] [place], corner of [address] (10 minutes form here); they are open 24/7 [address]  [place],  [address] [phone]   

## 2013-01-05 NOTE — Assessment & Plan Note (Addendum)
Td ~ 2007 Zostavax:Benefits discussed Had a flu shot ++ FH CAD---> NMR  07-2011 total cholesterol 179, LDL 105, LDL P 1559. Now on Lipitor, check FLP, MMR next year  multiple moles -- sees  Dr Terri Piedra  01-03-2013 Cscope---> 2 polyps, next per GI Concerned about his family history of lung  cancer, father was a smoker, the patient is not. Request a chest x-ray. Limitations of chest x-ray discussed with the patient, will order one. At the same time   will Rx a CT chest Addendum: I spoke to the patient today, chest x-ray is pending, he will think about a CT chest in the future. JP 01/24/2013 continue w/  exercise diet discussed

## 2013-01-06 LAB — CBC WITH DIFFERENTIAL/PLATELET
Basophils Absolute: 0 10*3/uL (ref 0.0–0.1)
Eosinophils Relative: 1.6 % (ref 0.0–5.0)
Hemoglobin: 15.4 g/dL (ref 13.0–17.0)
Lymphocytes Relative: 28.2 % (ref 12.0–46.0)
Monocytes Relative: 9 % (ref 3.0–12.0)
Platelets: 135 10*3/uL — ABNORMAL LOW (ref 150.0–400.0)
RDW: 13.7 % (ref 11.5–14.6)
WBC: 5.1 10*3/uL (ref 4.5–10.5)

## 2013-01-11 ENCOUNTER — Ambulatory Visit: Payer: BC Managed Care – PPO

## 2013-01-11 ENCOUNTER — Encounter: Payer: Self-pay | Admitting: *Deleted

## 2013-01-11 ENCOUNTER — Encounter: Payer: Self-pay | Admitting: Internal Medicine

## 2013-01-11 DIAGNOSIS — Z860100 Personal history of colon polyps, unspecified: Secondary | ICD-10-CM | POA: Insufficient documentation

## 2013-01-11 DIAGNOSIS — R7309 Other abnormal glucose: Secondary | ICD-10-CM

## 2013-01-11 HISTORY — DX: Personal history of colonic polyps: Z86.010

## 2013-01-11 HISTORY — DX: Personal history of colon polyps, unspecified: Z86.0100

## 2013-01-11 LAB — HEMOGLOBIN A1C: Hgb A1c MFr Bld: 5.9 % (ref 4.6–6.5)

## 2013-01-11 NOTE — Progress Notes (Signed)
Quick Note:  2 diminutive adenomas Repeat colon about 12/2017 ______

## 2013-01-26 ENCOUNTER — Ambulatory Visit (INDEPENDENT_AMBULATORY_CARE_PROVIDER_SITE_OTHER): Payer: BC Managed Care – PPO | Admitting: Sports Medicine

## 2013-01-26 ENCOUNTER — Encounter: Payer: Self-pay | Admitting: Sports Medicine

## 2013-01-26 VITALS — BP 132/88 | HR 66 | Ht 69.0 in | Wt 182.0 lb

## 2013-01-26 DIAGNOSIS — M79672 Pain in left foot: Secondary | ICD-10-CM

## 2013-01-26 DIAGNOSIS — M25562 Pain in left knee: Secondary | ICD-10-CM | POA: Insufficient documentation

## 2013-01-26 DIAGNOSIS — M79609 Pain in unspecified limb: Secondary | ICD-10-CM

## 2013-01-26 DIAGNOSIS — M25569 Pain in unspecified knee: Secondary | ICD-10-CM

## 2013-01-26 DIAGNOSIS — R269 Unspecified abnormalities of gait and mobility: Secondary | ICD-10-CM

## 2013-01-26 DIAGNOSIS — M79673 Pain in unspecified foot: Secondary | ICD-10-CM | POA: Insufficient documentation

## 2013-01-26 NOTE — Assessment & Plan Note (Signed)
We will correct there rear foot varus and see if this helps  Eccentric rehab exercises as well

## 2013-01-26 NOTE — Assessment & Plan Note (Signed)
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

## 2013-01-26 NOTE — Assessment & Plan Note (Signed)
His custom orthotics from podiatry are in good shape but do not correct his rearfoot  Rear foot varus wedge added to correct gait and elevate lateral heel  This improved gait and gave more comfort  Try this 6 weeks and then return and I will modify if needed

## 2013-01-26 NOTE — Patient Instructions (Addendum)
Use knee strap with running or playing sports  Please do suggested exercises daily  Please follow up in 6 weeks  Thank you for seeing [place] today!

## 2013-01-26 NOTE — Progress Notes (Signed)
Patient ID: Gary Huff, male   DOB: [birth year], 52 y.o.   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[employer] courtesy of Dr Thurston Hole Former soccer athlete in [place]  Since moving to [place] more running and has run several half marathons  Knee pain on left is anterior and MRI by Dr Thurston Hole had shown only some thinning of retropatellar cartilage  Left heel pain that is below and lateral to AT insertion Dr Thurston Hole noted abnormal heel varus and normal XRay  Sent for my eval to see if orthotics would help end heel pain so patient could return to running  Note his chiropractor made orthotics but they have not helped that much  PEXAM  Muscular and NAD  Left Knee: Normal to inspection with no erythema or effusion or obvious bony abnormalities. Palpation normal with no warmth or joint line tenderness or patellar tenderness or condyle tenderness. ROM normal in flexion and extension and lower leg rotation. Ligaments with solid consistent endpoints including ACL, PCL, LCL, MCL. Negative Mcmurray's and provocative meniscal tests. Non painful patellar compression. Patellar and quadriceps tendons unremarkable. Hamstring and quadriceps strength is normal.  Left foot Cavus shape bilat but good great toe motion No TTP over AT insertion Mild TTP at lateral heel  Running Gait Form is excellent but has rear foot varus bilaterally  US of Knee and Heel Knee shows no effusion Normal tendons and meniscii Some loss of cartilate on lateral and medial edges of patella in groove  Left Achilles tendon has some eccentric fibers that attach to the lateral heel Thse shows calcifications and hypoechoic change

## 2013-02-14 ENCOUNTER — Ambulatory Visit: Payer: BC Managed Care – PPO | Admitting: Sports Medicine

## 2013-02-23 ENCOUNTER — Ambulatory Visit: Payer: BC Managed Care – PPO | Admitting: Sports Medicine

## 2013-02-27 ENCOUNTER — Encounter: Payer: BC Managed Care – PPO | Admitting: Internal Medicine

## 2013-03-21 ENCOUNTER — Ambulatory Visit: Payer: BC Managed Care – PPO | Admitting: Sports Medicine

## 2013-05-02 ENCOUNTER — Encounter: Payer: Self-pay | Admitting: Sports Medicine

## 2013-05-02 ENCOUNTER — Ambulatory Visit (INDEPENDENT_AMBULATORY_CARE_PROVIDER_SITE_OTHER): Payer: BC Managed Care – PPO | Admitting: Sports Medicine

## 2013-05-02 VITALS — BP 127/79 | HR 61 | Ht 69.0 in | Wt 182.0 lb

## 2013-05-02 DIAGNOSIS — M79609 Pain in unspecified limb: Secondary | ICD-10-CM

## 2013-05-02 DIAGNOSIS — M25569 Pain in unspecified knee: Secondary | ICD-10-CM

## 2013-05-02 DIAGNOSIS — M25562 Pain in left knee: Secondary | ICD-10-CM

## 2013-05-02 DIAGNOSIS — M79672 Pain in left foot: Secondary | ICD-10-CM

## 2013-05-02 DIAGNOSIS — M25561 Pain in right knee: Secondary | ICD-10-CM

## 2013-05-02 NOTE — Assessment & Plan Note (Signed)
Pt exam and history consistent with PTFS of the knee that has improved since trying the patellar strap and  orthotics.  Pain, when present, is with prolonged sitting prior to standing, walking up stairs, or uphill running.  F/U after evaluation of his orthotics, which he will drop off in the next couple of days.  Exercises given specifically for quad strengthening as he seems to be increasing his hamstring dominance secondary to running which could be contributing to his patellar capsule compressing the patella into the patellar groove.

## 2013-05-02 NOTE — Assessment & Plan Note (Signed)
Much improved with intermittent rehab as well as orthotics.  Will have him drop off his orthotics and evaluate for possible adjustment and f/u visit.

## 2013-05-02 NOTE — Assessment & Plan Note (Addendum)
Pt starting to c/o R anterior knee pain slightly after orthotic modification at his last visit 3 months ago.  Pain, when present, is with prolonged sitting prior to standing, walking up stairs, or uphill running.  F/U after evaluation of his orthotics, which he will drop off in the next couple of days.  Exercises given specifically for quad strengthening as he seems to be increasing his hamstring dominance secondary to running which could be contributing to his patellar capsule compressing the patella into the patellar groove.    I do not see signs of DJD or significant knee injury  Return at some point with running shoes for gait eval/  Fit with patellar strap when we have one his size

## 2013-05-02 NOTE — Progress Notes (Signed)
Gary Huff is a 52 y.o. male who presents today for f/u of L knee pain and new onset R knee pain.  L Knee pain dx as PFPS in July, and was given strengthening exercises as well as patellar strap and adjustment to his custom orthotics.  Since that point, his knee pain is much improved since that last visit, really only pain with running up hills or occasionally with prolonged sitting before standing.    R Knee pain - Pt c/o similar pain to his left knee slightly after his last visit in July.  Pain is located anterior, denies injury to the knee or previous injury, no locking, catching, or instability.  Described as sharp pain with uphill running/prolonged sitting, and has tried the patellar strap for this knee which has helped somewhat.    For both knees, he has tried intermittent rehab exercises given to him at last visit, which he does about 3-4 x per week.    Past Medical History  Diagnosis Date  . Duodenal ulcer     twice in his 30s  . Normal stress echocardiogram 07/2010    no ischemia; normal LVF  . Dysplastic nevus 07-2010    Dr Terri Piedra, sees derm regularly  . Diverticulitis   . Personal history of colonic adenomas 01/11/2013   Current Outpatient Prescriptions on File Prior to Visit  Medication Sig Dispense Refill  . atorvastatin (LIPITOR) 10 MG tablet TOME UNA TABLETA POR VIA ORAL A DIARIO  30 tablet  1   No current facility-administered medications on file prior to visit.    ROS: Per HPI.  All other systems reviewed and are negative.   Physical Exam Filed Vitals:   05/02/13 1608  BP: 127/79  Pulse: 61    Physical Examination: General appearance - alert, well appearing, and in no distress Hip: Strength IR: 5/5, ER: 5/5, Flexion: 5/5, Extension: 5/5, Abduction: 5/5, Adduction: 5/5 Knee: Normal to inspection with no erythema or effusion or obvious bony abnormalities. Palpation normal with no warmth or joint line tenderness or patellar tenderness or condyle tenderness.  Trace crepitus over patella with knee extension B/L  ROM normal in flexion and extension and lower leg rotation. Ligaments with solid consistent endpoints including ACL, PCL, LCL, MCL. Negative Mcmurray's and provocative meniscal tests. Non painful patellar compression and apprehension Patellar and quadriceps tendons unremarkable. Hamstring strength is normal B/L.  He has 5/5 MS of the B/L quadriceps with minimal weakness present in vastus medialis B/L   Feet show loss of long arch

## 2013-05-02 NOTE — Patient Instructions (Signed)
Gary Huff, it was good seeing you again today.  Please try the following things:  1) Use the patellar strap on both knees 2) Please try performing some cross training exercises with biking or elliptical work 2-3 x per week 3) You may drop your orthotics off for modifications which we can make for you 4) Continue with the quadriceps strengthening as well as runners lunge (about 45 degrees of quick flexion, 3 sets of 15 reps), and a drop squat (lift your heels with a weight above your shoulders, and drop a squat to about 30-45 degrees, 3 sets of 15 reps).  Thank you, Dr. Darrick Penna and Dr. Paulina Fusi

## 2013-05-14 ENCOUNTER — Other Ambulatory Visit: Payer: Self-pay | Admitting: Internal Medicine

## 2013-05-15 NOTE — Telephone Encounter (Signed)
rx refilled per protocol. DJR  

## 2013-06-27 ENCOUNTER — Ambulatory Visit: Payer: BC Managed Care – PPO | Admitting: Sports Medicine

## 2013-06-27 ENCOUNTER — Encounter: Payer: Self-pay | Admitting: Sports Medicine

## 2013-06-27 ENCOUNTER — Ambulatory Visit (INDEPENDENT_AMBULATORY_CARE_PROVIDER_SITE_OTHER): Payer: BC Managed Care – PPO | Admitting: Sports Medicine

## 2013-06-27 VITALS — Ht 69.0 in | Wt 182.0 lb

## 2013-06-27 DIAGNOSIS — M7710 Lateral epicondylitis, unspecified elbow: Secondary | ICD-10-CM | POA: Insufficient documentation

## 2013-06-27 DIAGNOSIS — M7711 Lateral epicondylitis, right elbow: Secondary | ICD-10-CM

## 2013-06-27 DIAGNOSIS — M25561 Pain in right knee: Secondary | ICD-10-CM

## 2013-06-27 DIAGNOSIS — M25569 Pain in unspecified knee: Secondary | ICD-10-CM

## 2013-06-27 DIAGNOSIS — M25529 Pain in unspecified elbow: Secondary | ICD-10-CM

## 2013-06-27 MED ORDER — NITROGLYCERIN 0.2 MG/HR TD PT24: MEDICATED_PATCH | TRANSDERMAL | Status: DC

## 2013-06-27 MED ORDER — NITROGLYCERIN 0.1 MG/HR TD PT24: MEDICATED_PATCH | TRANSDERMAL | Status: DC

## 2013-06-27 NOTE — Assessment & Plan Note (Addendum)
Consistent with physical exam and US findings. Advised wrist pronation/supination/flexion/extension exercises. Body helix elbow sleeve given today as well.  Patient to avoid pulling luggage with right hand. Follow up in 6 weeks.   NTG patch protocol

## 2013-06-27 NOTE — Progress Notes (Signed)
   Subjective:    Patient ID: Gary Huff, male    DOB: [birth year], 52 y.o.   MRN: [id number]  HPI 52 year old male presents for follow up regarding knee pain.  Knee pain - Patient seen previously and Dx with PFS. - He continues to have pain R > L particularly with running and going up stairs/incline. He has tried a patellar strap and strengthening exercises.  He does exercises intermittently (not as often as he would like to).   R Elbow Pain - Lateral elbow pain  - Has been present for ~ 4 weeks. - He reports that he has tried Aleve with some improvement.  - Of note, he travels frequently.  He is often pulling a rolling luggage bag.  Review of Systems Per HPI    Objective:   Physical Exam General: well appearing male in NAD. MSK:  Knee: Normal to inspection with no erythema or effusion or obvious bony abnormalities. Palpation normal with no warmth, joint line tenderness, patellar tenderness, or condyle tenderness. ROM full in flexion and extension and lower leg rotation. Ligaments with solid consistent endpoints including ACL, PCL, LCL, MCL. Negative Mcmurray's.  Non painful patellar compression. Patellar glide without crepitus. Patellar and quadriceps tendons unremarkable. Hamstring and quadriceps strength is normal.   Elbow: Unremarkable to inspection. Range of motion full pronation, supination, flexion, extension. Strength is full to all of the above directions Pain with book test with arm abducted and extended.  Pain is located at the lateral epicondyle.  Mild TTP     Assessment & Plan:  See Problem List

## 2013-06-27 NOTE — Patient Instructions (Signed)
Continue your knee exercises.  Do the wrist/elbow exercises given.  Nitroglycerin Protocol   Apply 1/4 nitroglycerin patch to affected area daily.  Change position of patch within the affected area every 24 hours.  You may experience a headache during the first 1-2 weeks of using the patch, these should subside.  If you experience headaches after beginning nitroglycerin patch treatment, you may take your preferred over the counter pain reliever.  Another side effect of the nitroglycerin patch is skin irritation or rash related to patch adhesive.  Please notify our office if you develop more severe headaches or rash, and stop the patch.  Tendon healing with nitroglycerin patch may require 12 to 24 weeks depending on the extent of injury.  Men should not use if taking Viagra, Cialis, or Levitra.   Do not use if you have migraines or rosacea.

## 2013-06-27 NOTE — Assessment & Plan Note (Addendum)
US performed today.  No evidence of DJD or meniscal tear.   Patient to continue strengthening exercises.  Follow up in 6 weeks.

## 2013-06-28 ENCOUNTER — Other Ambulatory Visit: Payer: Self-pay | Admitting: *Deleted

## 2013-06-28 MED ORDER — NITROGLYCERIN 0.2 MG/HR TD PT24: MEDICATED_PATCH | TRANSDERMAL | Status: DC

## 2013-09-02 ENCOUNTER — Other Ambulatory Visit: Payer: Self-pay | Admitting: Internal Medicine

## 2013-09-26 ENCOUNTER — Ambulatory Visit: Payer: BC Managed Care – PPO | Admitting: Sports Medicine

## 2013-10-18 ENCOUNTER — Other Ambulatory Visit: Payer: Self-pay | Admitting: Internal Medicine

## 2013-10-19 ENCOUNTER — Encounter: Payer: Self-pay | Admitting: Internal Medicine

## 2013-10-19 ENCOUNTER — Ambulatory Visit (INDEPENDENT_AMBULATORY_CARE_PROVIDER_SITE_OTHER): Payer: BC Managed Care – PPO | Admitting: Internal Medicine

## 2013-10-19 VITALS — BP 120/74 | HR 75 | Temp 98.0°F | Wt 191.0 lb

## 2013-10-19 DIAGNOSIS — J069 Acute upper respiratory infection, unspecified: Secondary | ICD-10-CM

## 2013-10-19 DIAGNOSIS — K5732 Diverticulitis of large intestine without perforation or abscess without bleeding: Secondary | ICD-10-CM

## 2013-10-19 DIAGNOSIS — K219 Gastro-esophageal reflux disease without esophagitis: Secondary | ICD-10-CM

## 2013-10-19 LAB — URINALYSIS, ROUTINE W REFLEX MICROSCOPIC
Bilirubin Urine: NEGATIVE
HGB URINE DIPSTICK: NEGATIVE
Ketones, ur: NEGATIVE
LEUKOCYTES UA: NEGATIVE
NITRITE: NEGATIVE
PH: 6.5 (ref 5.0–8.0)
Specific Gravity, Urine: 1.02 (ref 1.000–1.030)
Total Protein, Urine: NEGATIVE
Urine Glucose: NEGATIVE
Urobilinogen, UA: 0.2 (ref 0.0–1.0)

## 2013-10-19 MED ORDER — CIPROFLOXACIN HCL 500 MG PO TABS: 500.0000 mg | ORAL_TABLET | Freq: Two times a day (BID) | ORAL | Status: DC

## 2013-10-19 MED ORDER — METRONIDAZOLE 500 MG PO TABS: 500.0000 mg | ORAL_TABLET | Freq: Three times a day (TID) | ORAL | Status: DC

## 2013-10-19 NOTE — Assessment & Plan Note (Addendum)
Patient with a documented episode of diverticulitis per CT 04-2012, also   a colonoscopy that showed diverticuli presents w/  recurrent lower abdominal pain that self resolve. Latest episode started this morning. Today he is afebrile and has no nausea or vomiting. He probably has recurrent diverticulitis. Will treat empirically today with Cipro and Flagyl. If he is not better he needs to let me know. I provided a refill of antibiotics in case he has another attack. Consider  GI consult if he continue having on and off symptoms.

## 2013-10-19 NOTE — Patient Instructions (Addendum)
Stop by the lab before you leave the office today  Start ciprofloxacin and Flagyl for one week for diverticulitis.  If you ever have severe diverticulitis symptoms, fever, chills or you are not improving within 48 hours let me know or go to the ER.  Next visit for a physical exam by  7- 2015      Diverticulitis A diverticulum is a small pouch or sac on the colon. Diverticulosis is the presence of these diverticula on the colon. Diverticulitis is the irritation (inflammation) or infection of diverticula. CAUSES  The colon and its diverticula contain bacteria. If food particles block the tiny opening to a diverticulum, the bacteria inside can grow and cause an increase in pressure. This leads to infection and inflammation and is called diverticulitis. SYMPTOMS   Abdominal pain and tenderness. Usually, the pain is located on the left side of your abdomen. However, it could be located elsewhere.  Fever.  Bloating.  Feeling sick to your stomach (nausea).  Throwing up (vomiting).  Abnormal stools. DIAGNOSIS  Your caregiver will take a history and perform a physical exam. Since many things can cause abdominal pain, other tests may be necessary. Tests may include:  Blood tests.  Urine tests.  X-ray of the abdomen.  CT scan of the abdomen. Sometimes, surgery is needed to determine if diverticulitis or other conditions are causing your symptoms. TREATMENT  Most of the time, you can be treated without surgery. Treatment includes:  Resting the bowels by only having liquids for a few days. As you improve, you will need to eat a low-fiber diet.  Intravenous (IV) fluids if you are losing body fluids (dehydrated).  Antibiotic medicines that treat infections may be given.  Pain and nausea medicine, if needed.  Surgery if the inflamed diverticulum has burst. HOME CARE INSTRUCTIONS   Try a clear liquid diet (broth, tea, or water for as long as directed by your caregiver). You may  then gradually begin a low-fiber diet as tolerated.  A low-fiber diet is a diet with less than 10 grams of fiber. Choose the foods below to reduce fiber in the diet:  White breads, cereals, rice, and pasta.  Cooked fruits and vegetables or soft fresh fruits and vegetables without the skin.  Ground or well-cooked tender beef, ham, veal, lamb, pork, or poultry.  Eggs and seafood.  After your diverticulitis symptoms have improved, your caregiver may put you on a high-fiber diet. A high-fiber diet includes 14 grams of fiber for every 1000 calories consumed. For a standard 2000 calorie diet, you would need 28 grams of fiber. Follow these diet guidelines to help you increase the fiber in your diet. It is important to slowly increase the amount fiber in your diet to avoid gas, constipation, and bloating.  Choose whole-grain breads, cereals, pasta, and brown rice.  Choose fresh fruits and vegetables with the skin on. Do not overcook vegetables because the more vegetables are cooked, the more fiber is lost.  Choose more nuts, seeds, legumes, dried peas, beans, and lentils.  Look for food products that have greater than 3 grams of fiber per serving on the Nutrition Facts label.  Take all medicine as directed by your caregiver.  If your caregiver has given you a follow-up appointment, it is very important that you go. Not going could result in lasting (chronic) or permanent injury, pain, and disability. If there is any problem keeping the appointment, call to reschedule. SEEK MEDICAL CARE IF:   Your pain does not  improve.  You have a hard time advancing your diet beyond clear liquids.  Your bowel movements do not return to normal. SEEK IMMEDIATE MEDICAL CARE IF:   Your pain becomes worse.  You have an oral temperature above 102 F (38.9 C), not controlled by medicine.  You have repeated vomiting.  You have bloody or black, tarry stools.  Symptoms that brought you to your caregiver  become worse or are not getting better. MAKE SURE YOU:   Understand these instructions.  Will watch your condition.  Will get help right away if you are not doing well or get worse. Document Released: 04/15/2005 Document Revised: 09/28/2011 Document Reviewed: 08/11/2010 [facility] Patient Information 2014 [place].

## 2013-10-19 NOTE — Progress Notes (Signed)
Subjective:    Patient ID: Gary Huff, male    DOB: [birth year], 53 y.o.   MRN: [id number]  DOS:  10/19/2013 Type of  visit: Acute visit --->  patient is concerned about diverticulitis.  He experiences on and off lower abdominal discomfort, associated with increase gas in the pelvic area, happens every 2 or 3 months, usually self resolve within 2-3 days. Related to certain foods like meats? Latest episode started this morning and that triggered the visit. Symptoms decrease usually after he has pass gas.  Also, one-week history of sinus pressure, taking Mucinex and a decongestant, already feeling slightly better.  Has GERD symptoms usually well controlled with omeprazole as needed   ROS Denies fever or chills No nausea, vomiting. No dysuria, gross hematuria or difficulty urinating. No diarrhea or blood in the stools  Past Medical History  Diagnosis Date  . Duodenal ulcer     twice in his 58s  . Normal stress echocardiogram 07/2010    no ischemia; normal LVF  . Dysplastic nevus 07-2010    Dr Allyson Sabal, sees derm regularly  . Diverticulitis   . Personal history of colonic adenomas 01/11/2013    Past Surgical History  Procedure Laterality Date  . Eye surgery      x 2    History   Social History  . Marital Status: Married    Spouse Name: N/A    Number of Children: 3  . Years of Education: N/A   Occupational History  . [employer]    Social History Main Topics  . Smoking status: Never Smoker   . Smokeless tobacco: Never Used  . Alcohol Use: 2.4 oz/week    4 Glasses of wine per week     Comment: socially  . Drug Use: No  . Sexual Activity: Not on file   Other Topics Concern  . 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  Always use your most recent med list.               atorvastatin 10 MG tablet    Commonly known as:  LIPITOR  TOME UNA TABLETA POR VIA ORAL A DIARIO     ciprofloxacin 500 MG tablet  Commonly known as:  CIPRO  Take 1 tablet (500 mg total) by mouth 2 (two) times daily.     metroNIDAZOLE 500 MG tablet  Commonly known as:  FLAGYL  Take 1 tablet (500 mg total) by mouth 3 (three) times daily.           Objective:   Physical Exam BP 120/74  Pulse 75  Temp(Src) 98 F (36.7 C)  Wt 191 lb (86.637 kg)  SpO2 94% General -- alert, well-developed, NAD.    HEENT-- Not pale. TMs normal, throat symmetric, no redness or discharge. Face symmetric, sinuses not tender to palpation. Nose slt congested.  Lungs -- normal respiratory effort, no intercostal retractions, no accessory muscle use, and normal breath sounds.  Heart-- normal rate, regular rhythm, no murmur.  Abdomen-- Not distended, good bowel sounds,soft. Mild tenderness to palpation of the lower abdomen, mostly at the suprapubic area, no mass or rebound  Extremities-- no pretibial edema bilaterally  Neurologic--  alert & oriented X3. Speech normal, gait normal, strength normal in  all extremities.   Psych-- Cognition and judgment appear intact. Cooperative with normal attention span and concentration. No anxious or depressed appearing.     Assessment & Plan:   GERD, okay to take omeprazole as needed URI, already getting better, recommend to call if not improving.

## 2013-12-22 ENCOUNTER — Ambulatory Visit (INDEPENDENT_AMBULATORY_CARE_PROVIDER_SITE_OTHER): Payer: BC Managed Care – PPO | Admitting: Internal Medicine

## 2013-12-22 ENCOUNTER — Encounter: Payer: Self-pay | Admitting: Internal Medicine

## 2013-12-22 VITALS — BP 138/86 | HR 66 | Temp 98.0°F | Wt 193.0 lb

## 2013-12-22 DIAGNOSIS — K5732 Diverticulitis of large intestine without perforation or abscess without bleeding: Secondary | ICD-10-CM

## 2013-12-22 DIAGNOSIS — B029 Zoster without complications: Secondary | ICD-10-CM

## 2013-12-22 HISTORY — DX: Zoster without complications: B02.9

## 2013-12-22 MED ORDER — METRONIDAZOLE 500 MG PO TABS: 500.0000 mg | ORAL_TABLET | Freq: Three times a day (TID) | ORAL | Status: DC

## 2013-12-22 MED ORDER — CIPROFLOXACIN HCL 500 MG PO TABS: 500.0000 mg | ORAL_TABLET | Freq: Two times a day (BID) | ORAL | Status: DC

## 2013-12-22 NOTE — Patient Instructions (Addendum)
Finish valtrex Use hydrodcortisone 1% cream OTC Call if rash no better in 2 or 3  weeks   Please schedule a physical within next few weeks

## 2013-12-22 NOTE — Assessment & Plan Note (Signed)
Although the rash is not hurting, it most likely represent shingles. Plan: Finish Valtrex, OTC hydrocortisone as needed, call if not improving

## 2013-12-22 NOTE — Progress Notes (Signed)
Pre visit review using our clinic review tool, if applicable. No additional management support is needed unless otherwise documented below in the visit note. 

## 2013-12-22 NOTE — Assessment & Plan Note (Signed)
After the last visit, symptoms self resolved however the LLQ pain resurface 3 days ago, he self started  antibiotics and is feeling better. Again we discussed a most likely he has recurrent diverticulitis, other consideration is IBS. We discussed possibly a GI referral, surgery referral for consideration of  preemptive surgery (which he is not in favor of). We agree to continue with the same strategy for now which is self start antibiotics as needed and  prompt medical evaluation if not better

## 2013-12-22 NOTE — Progress Notes (Signed)
   Subjective:    Patient ID: Gary Huff, male    DOB: 28-Aug-1960, 53 y.o.   MRN: [id number]  DOS:  12/22/2013 Type of  Visit: acute  History: 6 days ago developed a rash at the left back, went to the urgent care, rashwas felt to be shingles and he was prescribed Valtrex. The rash does not hurt, has very mild itching. Since that time, looks better, less red.  Also, he was seen 10/19/2013 with probably diverticulitis,Symptoms self resolved and did not have to use antibiotic however 3 days ago developed again moderate LLQ abdominal pain, self started Cipro and Flagyl and he's doing better.  ROS Denies any fever, chills. No dysuria, gross hematuria, no diarrhea or blood in the stools.  Past Medical History  Diagnosis Date  . Duodenal ulcer     twice in his 91s  . Normal stress echocardiogram 07/2010    no ischemia; normal LVF  . Dysplastic nevus 07-2010    Dr Allyson Sabal, sees derm regularly  . Diverticulitis   . Personal history of colonic adenomas 01/11/2013    Past Surgical History  Procedure Laterality Date  . Eye surgery      x 2    History   Social History  . Marital Status: Married    Spouse Name: N/A    Number of Children: 3  . Years of Education: N/A   Occupational History  . [employer]    Social History Main Topics  . Smoking status: Never Smoker   . Smokeless tobacco: Never Used  . Alcohol Use: 2.4 oz/week    4 Glasses of wine per week     Comment: socially  . Drug Use: No  . Sexual Activity: Not on file   Other Topics Concern  . 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  Always use your most recent med list.               atorvastatin 10 MG tablet  Commonly known as:  LIPITOR  TOME UNA TABLETA POR VIA ORAL A DIARIO     ciprofloxacin 500 MG tablet  Commonly known as:  CIPRO  Take 1 tablet  (500 mg total) by mouth 2 (two) times daily.     metroNIDAZOLE 500 MG tablet  Commonly known as:  FLAGYL  Take 1 tablet (500 mg total) by mouth 3 (three) times daily.           Objective:   Physical Exam  Pulmonary/Chest:    Abdominal:     BP 138/86  Pulse 66  Temp(Src) 98 F (36.7 C)  Wt 193 lb (87.544 kg)  SpO2 96% General -- alert, well-developed, NAD.    Abdomen-- Not distended, good bowel sounds,soft, slt tender at the LLQ w/o  rebound or rigidity. No mass,organomegaly.  Psych-- Cognition and judgment appear intact. Cooperative with normal attention span and concentration. No anxious or depressed appearing.        Assessment & Plan:     Today , I spent more than 27   min with the patient: >50% of the time counseling regards rash (likely shingles, other etiologies poss) and diverticulitis options

## 2014-01-31 ENCOUNTER — Ambulatory Visit (INDEPENDENT_AMBULATORY_CARE_PROVIDER_SITE_OTHER): Payer: BC Managed Care – PPO | Admitting: Internal Medicine

## 2014-01-31 ENCOUNTER — Encounter: Payer: Self-pay | Admitting: Internal Medicine

## 2014-01-31 VITALS — BP 121/70 | HR 60 | Temp 97.8°F | Ht 68.6 in | Wt 188.0 lb

## 2014-01-31 DIAGNOSIS — R7303 Prediabetes: Secondary | ICD-10-CM | POA: Insufficient documentation

## 2014-01-31 DIAGNOSIS — Z0000 Encounter for general adult medical examination without abnormal findings: Secondary | ICD-10-CM

## 2014-01-31 DIAGNOSIS — K5732 Diverticulitis of large intestine without perforation or abscess without bleeding: Secondary | ICD-10-CM

## 2014-01-31 LAB — CBC WITH DIFFERENTIAL/PLATELET
BASOS PCT: 0.5 % (ref 0.0–3.0)
Basophils Absolute: 0 10*3/uL (ref 0.0–0.1)
Eosinophils Absolute: 0.1 10*3/uL (ref 0.0–0.7)
Eosinophils Relative: 1.5 % (ref 0.0–5.0)
HCT: 46.9 % (ref 39.0–52.0)
Hemoglobin: 15.7 g/dL (ref 13.0–17.0)
LYMPHS ABS: 1.6 10*3/uL (ref 0.7–4.0)
Lymphocytes Relative: 28.8 % (ref 12.0–46.0)
MCHC: 33.5 g/dL (ref 30.0–36.0)
MCV: 91.4 fl (ref 78.0–100.0)
MONO ABS: 0.5 10*3/uL (ref 0.1–1.0)
Monocytes Relative: 8 % (ref 3.0–12.0)
NEUTROS PCT: 61.2 % (ref 43.0–77.0)
Neutro Abs: 3.5 10*3/uL (ref 1.4–7.7)
PLATELETS: 157 10*3/uL (ref 150.0–400.0)
RBC: 5.13 Mil/uL (ref 4.22–5.81)
RDW: 14.3 % (ref 11.5–15.5)
WBC: 5.7 10*3/uL (ref 4.0–10.5)

## 2014-01-31 LAB — COMPREHENSIVE METABOLIC PANEL
ALBUMIN: 4.9 g/dL (ref 3.5–5.2)
ALT: 34 U/L (ref 0–53)
AST: 26 U/L (ref 0–37)
Alkaline Phosphatase: 44 U/L (ref 39–117)
BUN: 21 mg/dL (ref 6–23)
CO2: 31 mEq/L (ref 19–32)
Calcium: 9.7 mg/dL (ref 8.4–10.5)
Chloride: 101 mEq/L (ref 96–112)
Creatinine, Ser: 1.2 mg/dL (ref 0.4–1.5)
GFR: 68.73 mL/min (ref >60.00)
Glucose, Bld: 101 mg/dL — ABNORMAL HIGH (ref 70–99)
POTASSIUM: 3.8 meq/L (ref 3.5–5.1)
SODIUM: 136 meq/L (ref 135–145)
TOTAL PROTEIN: 7.5 g/dL (ref 6.0–8.3)
Total Bilirubin: 1.4 mg/dL — ABNORMAL HIGH (ref 0.2–1.2)

## 2014-01-31 LAB — LIPID PANEL
CHOL/HDL RATIO: 3
Cholesterol: 128 mg/dL (ref 0–200)
HDL: 46.9 mg/dL (ref >39.00)
LDL Cholesterol: 63 mg/dL (ref 0–99)
NONHDL: 81.1
Triglycerides: 89 mg/dL (ref 0.0–149.0)
VLDL: 17.8 mg/dL (ref 0.0–40.0)

## 2014-01-31 LAB — PSA: PSA: 1.93 ng/mL (ref 0.10–4.00)

## 2014-01-31 LAB — HEMOGLOBIN A1C: Hgb A1c MFr Bld: 5.7 % (ref 4.6–6.5)

## 2014-01-31 NOTE — Progress Notes (Signed)
Subjective:    Patient ID: Gary Huff, male    DOB: 1960/09/21, 53 y.o.   MRN: [id number]  DOS:  01/31/2014 Type of visit - description: CPX History: In general feeling well, he is a runner, runs 3 times a week. Trying to eat healthier.   ROS  Denies chest pain, difficulty breathing, extremity edema or dyspnea on exertion Denies nausea, vomiting, diarrhea. + Stress at work slightly anxious, and no depression. Denies dysuria, difficulty urinating or gross hematuria.  Past Medical History  Diagnosis Date  . Duodenal ulcer     twice in his 68s  . Normal stress echocardiogram 07/2010    no ischemia; normal LVF  . Dysplastic nevus 07-2010    Dr Allyson Sabal, sees derm regularly  . Diverticulitis     recurrent   . Personal history of colonic adenomas 01/11/2013    Past Surgical History  Procedure Laterality Date  . Eye surgery      x 2    History   Social History  . Marital Status: Married    Spouse Name: N/A    Number of Children: 3  . Years of Education: N/A   Occupational History  . [employer]    Social History Main Topics  . Smoking status: Never Smoker   . Smokeless tobacco: Never Used  . Alcohol Use: 2.4 oz/week    4 Glasses of wine per week     Comment: socially  . Drug Use: No  . Sexual Activity: Not on file   Other Topics Concern  . 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 Hypertension Brother   . Diabetes Neg Hx   . Colon cancer Neg Hx   . Colon polyps Neg Hx   . Prostate cancer Neg Hx   . Rectal cancer Neg Hx   . Stomach cancer Neg Hx   . Coronary artery disease Other     GF, 3 uncles, Brother  (stent  at age 70)  . Lung cancer Father     smoker       Medication List       This list is accurate as of: 01/31/14  2:28 PM.  Always use your most recent med list.               atorvastatin 10 MG tablet    Commonly known as:  LIPITOR  TOME UNA TABLETA POR VIA ORAL A DIARIO     ciprofloxacin 500 MG tablet  Commonly known as:  CIPRO  Take 1 tablet (500 mg total) by mouth 2 (two) times daily.     metroNIDAZOLE 500 MG tablet  Commonly known as:  FLAGYL  Take 1 tablet (500 mg total) by mouth 3 (three) times daily.     valACYclovir 1000 MG tablet  Commonly known as:  VALTREX           Objective:   Physical Exam BP 121/70  Pulse 60  Temp(Src) 97.8 F (36.6 C)  Ht 5' 8.6" (1.742 m)  Wt 188 lb (85.276 kg)  BMI 28.10 kg/m2  SpO2 97% General -- alert, well-developed, NAD.  Neck --no thyromegaly , normal carotid pulse  HEENT-- Not pale.  Lungs -- normal respiratory effort, no intercostal retractions, no accessory muscle use, and normal breath sounds.  Heart-- normal rate, regular  rhythm, no murmur.  Abdomen-- Not distended, good bowel sounds,soft, non-tender.  Rectal-- No external abnormalities noted. Normal sphincter tone. No rectal masses or tenderness.no stools found Prostate--Prostate gland firm and smooth, no enlargement, nodularity, tenderness, mass, asymmetry or induration. Extremities-- no pretibial edema bilaterally  Neurologic--  alert & oriented X3. Speech normal, gait appropriate for age, strength symmetric and appropriate for age.  Psych-- Cognition and judgment appear intact. Cooperative with normal attention span and concentration. No anxious or depressed appearing.        Assessment & Plan:

## 2014-01-31 NOTE — Progress Notes (Signed)
Pre visit review using our clinic review tool, if applicable. No additional management support is needed unless otherwise documented below in the visit note. 

## 2014-01-31 NOTE — Assessment & Plan Note (Signed)
a1c last year 5.9, prediabetes concept discussed Labs

## 2014-01-31 NOTE — Assessment & Plan Note (Addendum)
Td ~ 2007 Zostavax:Benefits discussed before   ++ FH CAD---> nuclear stress test-stress ECHO  (-) 2012 multiple moles -- sees  Dr Allyson Sabal  01-03-2013 Cscope---> 2 polyps, next 5 year  Anxiety-- counseled, to see a counselor if needed  Diet exercise-- doing great

## 2014-01-31 NOTE — Assessment & Plan Note (Signed)
Current strategy is   antibiotics as needed

## 2014-01-31 NOTE — Patient Instructions (Signed)
Get your blood work before you leave   Next visit is for a physical exam in 1 year and as needed . Come back fasting Please make an appointment

## 2014-02-01 ENCOUNTER — Ambulatory Visit (INDEPENDENT_AMBULATORY_CARE_PROVIDER_SITE_OTHER): Payer: BC Managed Care – PPO | Admitting: Licensed Clinical Social Worker

## 2014-02-01 DIAGNOSIS — F411 Generalized anxiety disorder: Secondary | ICD-10-CM

## 2014-02-20 ENCOUNTER — Ambulatory Visit: Payer: BC Managed Care – PPO | Admitting: Licensed Clinical Social Worker

## 2014-02-21 ENCOUNTER — Ambulatory Visit (INDEPENDENT_AMBULATORY_CARE_PROVIDER_SITE_OTHER): Payer: BC Managed Care – PPO | Admitting: Licensed Clinical Social Worker

## 2014-02-21 DIAGNOSIS — F411 Generalized anxiety disorder: Secondary | ICD-10-CM

## 2014-03-20 ENCOUNTER — Ambulatory Visit (INDEPENDENT_AMBULATORY_CARE_PROVIDER_SITE_OTHER): Payer: BC Managed Care – PPO | Admitting: Licensed Clinical Social Worker

## 2014-03-20 DIAGNOSIS — F411 Generalized anxiety disorder: Secondary | ICD-10-CM

## 2014-05-01 IMAGING — CT CT ABD-PELV W/ CM
2 of 5 series · 15 of 46 positions shown, 17 images · IV contrast (APPLIED)
Comparison: None

CLINICAL DATA: Left lower quadrant abdominal pain, evaluate for
diverticulitis/early appendicitis

CT ABDOMEN AND PELVIS WITH CONTRAST
TECHNIQUE: Multidetector CT imaging of the abdomen and pelvis was
performed following the standard protocol during bolus
administration of intravenous contrast.
Contrast: 100mL OMNIPAQUE IOHEXOL 300 MG/ML  SOLN

[Series 2: abd/pelvis 5.0 b31f · axial · 0.67mm/px · z∈[-501,-81]mm · 12 of 96 slices shown, 14 images]
[im 6/96  soft-tissue]
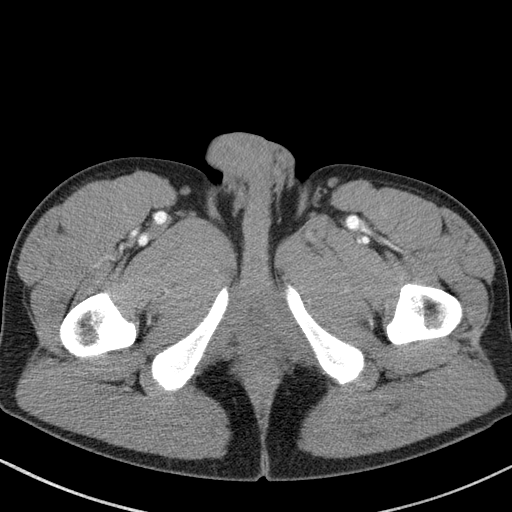
[im 6/96  bone]
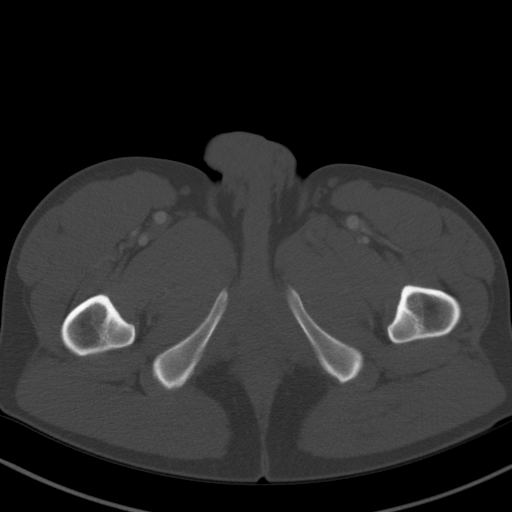
[im 16/96  soft-tissue]
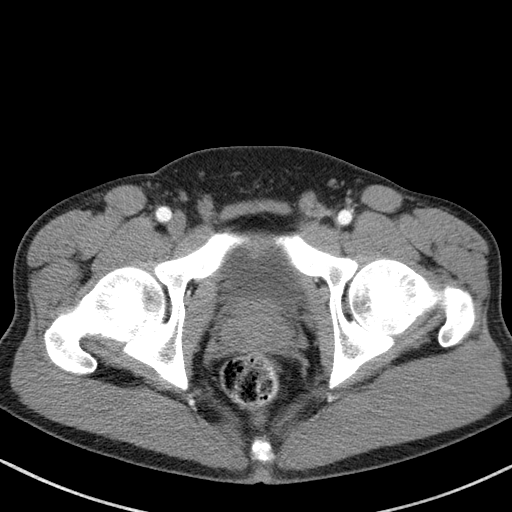
[im 22/96  soft-tissue]
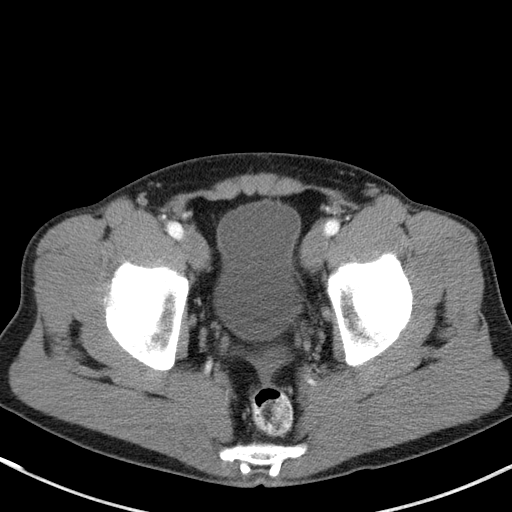
[im 27/96  soft-tissue]
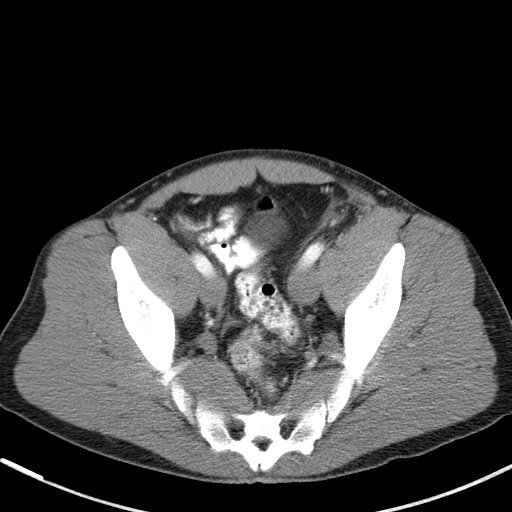
[im 37/96  soft-tissue]
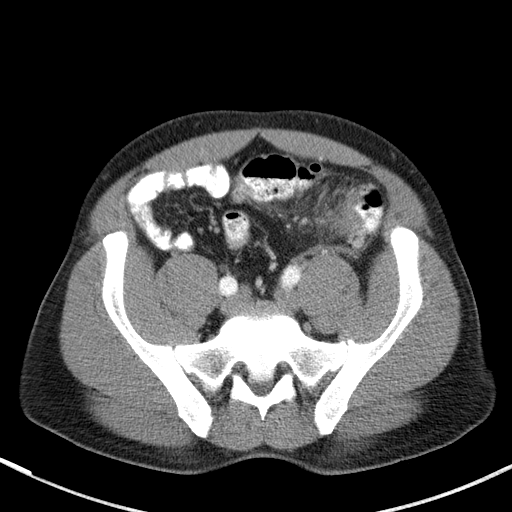
[im 43/96  soft-tissue]
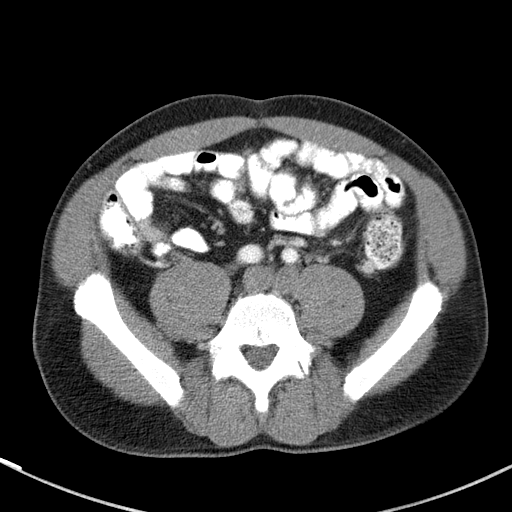
[im 53/96  soft-tissue]
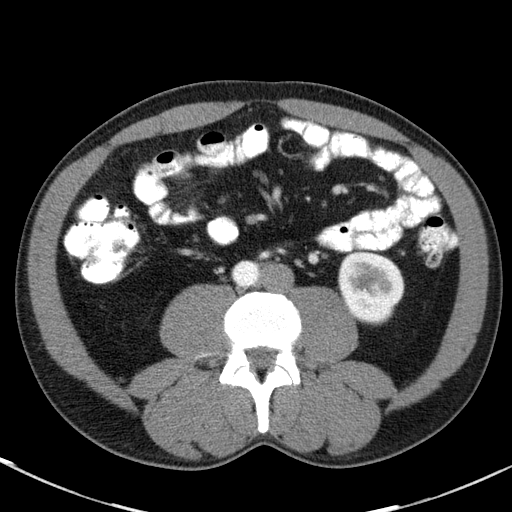
[im 59/96  soft-tissue]
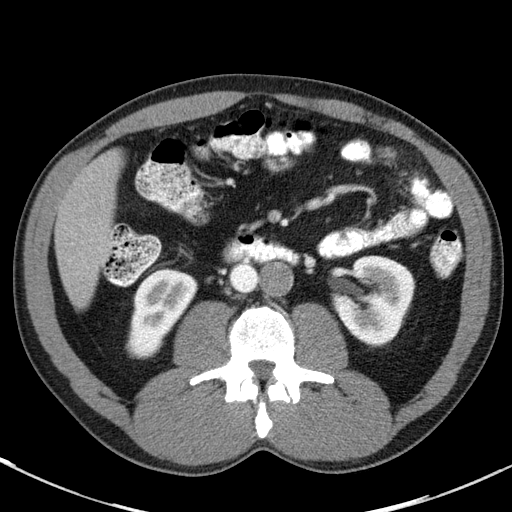
[im 69/96  soft-tissue]
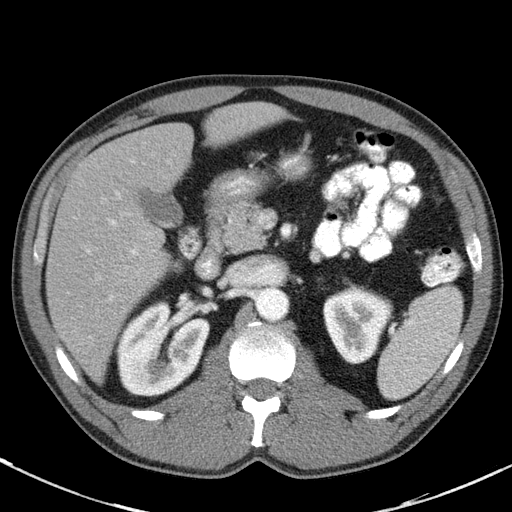
[im 69/96  bone]
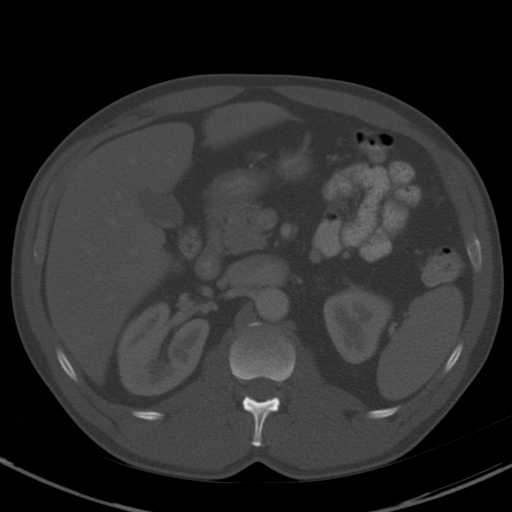
[im 74/96  soft-tissue]
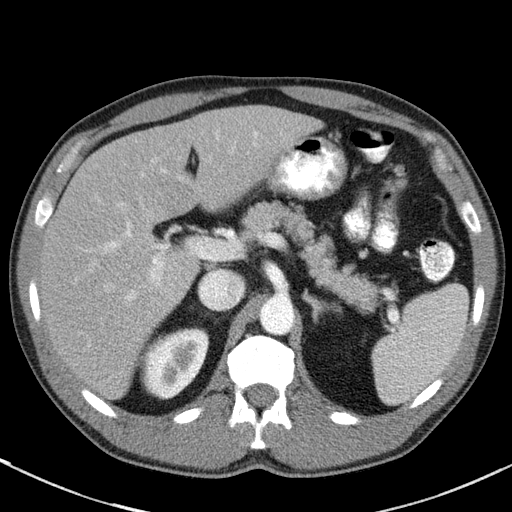
[im 80/96  soft-tissue]
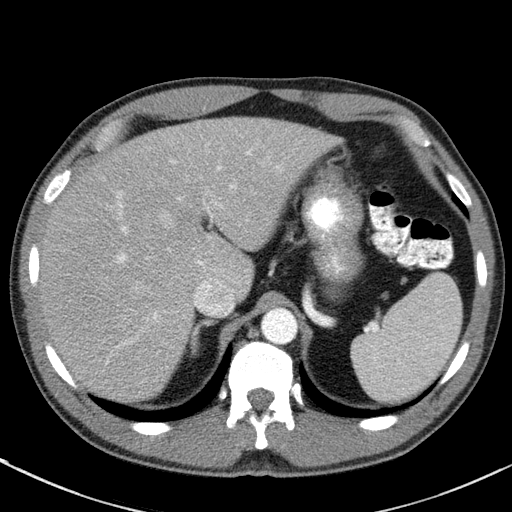
[im 90/96  soft-tissue]
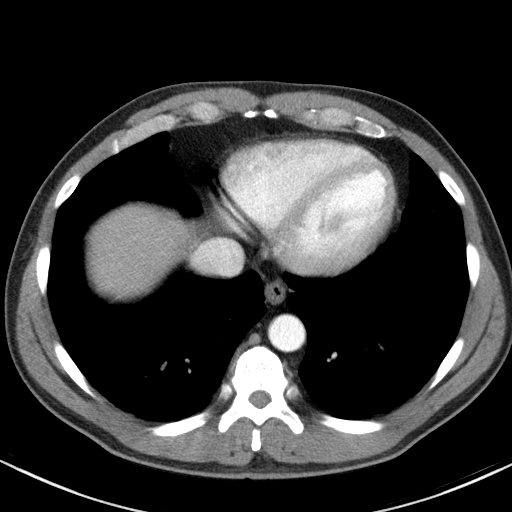

[Series 5: abd/pelvis 3.0 coronal · coronal · 0.70mm/px · 3 of 90 slices shown]
[im 30/90  soft-tissue]
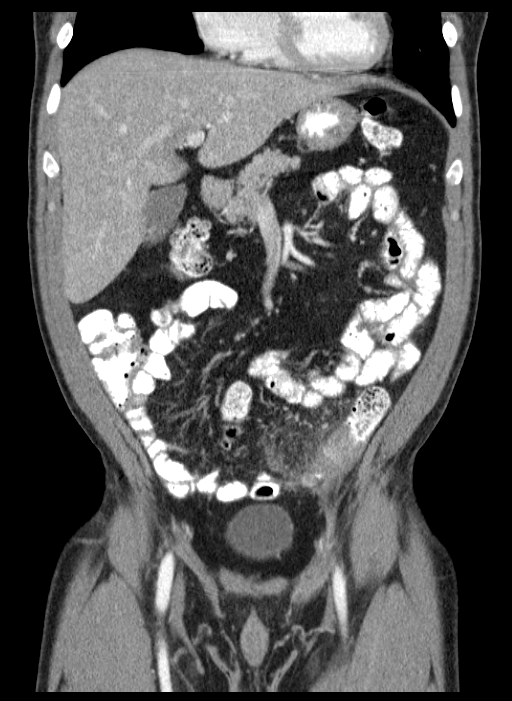
[im 40/90  soft-tissue]
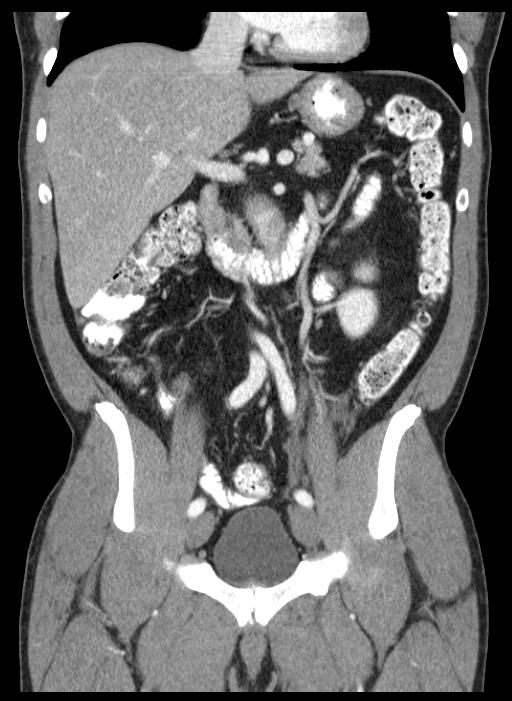
[im 50/90  soft-tissue]
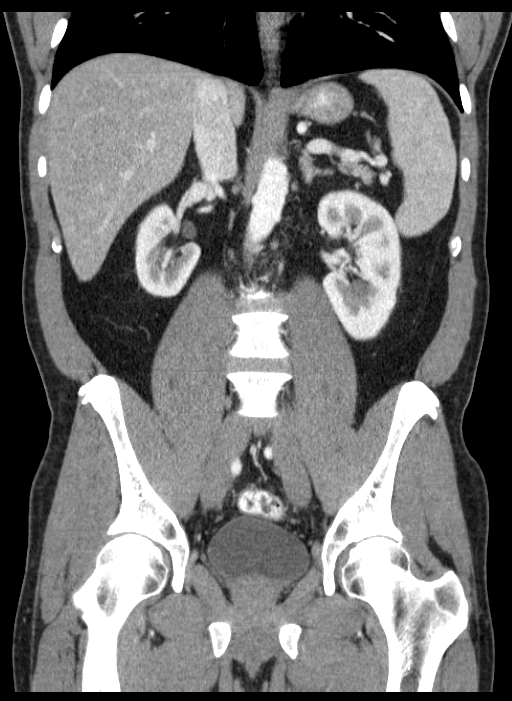

[15 of 46 positions shown; findings below may reference images not displayed]

FINDINGS: Colonic diverticulosis with  wall thickening inflammatory change
within the mid aspect of the sigmoid colon within the left lower
abdominal quadrant (axial images 59 through 65, series 2),
compatible with acute diverticulitis.  There is a minimal amount of
fluid seen to the level of the mesenteric root.  No
drainable/definable fluid collection. No pneumoperitoneum to
suggest perforation.

Enteric contrast extensive level of the rectum.  No evidence of
enteric obstruction.  The bowel is otherwise normal in course and
caliber without wall thickening or evidence of obstruction.  Normal
appearance of the appendix.  No pneumoperitoneum, pneumatosis or
portal venous gas.

Normal hepatic contour.  No discrete hepatic lesions.  Dependent
high density material within the gallbladder is favored to
represent layering opaque gallstones or sludge (axial image 28,
series 2, coronal image 36).  No intra or extrahepatic biliary
ductal dilatation.  No ascites.

There is symmetric enhancement excretion of the bilateral kidneys.
No urinary obstruction.  No definite renal stones.  No renal
lesions.  Normal appearance of the bilateral adrenal glands,
pancreas and spleen.

Normal caliber of the abdominal aorta.  The major branch vessels of
the abdominal aorta are patent.  Incidental note is made of a left-
sided IVC.  No retroperitoneal, mesenteric, pelvic or inguinal
lymphadenopathy.

Prostatic calcifications.  No free fluid in the pelvis.

Limited visualization of the lower thorax demonstrates minimal
dependent atelectasis.  No focal airspace opacities or pleural
effusion.

Normal heart size.  No pericardial effusion.

No acute or aggressive osseous abnormalities.
IMPRESSION: 1. Findings compatible with acute uncomplicated diverticulitis
within the mid/distal aspect of the sigmoid colon within the left
lower abdominal quadrant.  No evidence of perforation or
definable/drainable fluid collection.  If not recently performed,
further evaluation with colonoscopy after the resolution of acute
symptoms is recommended to evaluate for an underlying lesion/mass.
2.  Cholelithiasis without evidence of cholecystitis.

3.  Left-sided IVC incidentally noted.

## 2014-05-31 ENCOUNTER — Other Ambulatory Visit: Payer: Self-pay | Admitting: Internal Medicine

## 2014-06-07 ENCOUNTER — Ambulatory Visit: Payer: BC Managed Care – PPO | Admitting: Licensed Clinical Social Worker

## 2014-10-24 ENCOUNTER — Other Ambulatory Visit: Payer: Self-pay

## 2015-02-04 ENCOUNTER — Encounter: Payer: Self-pay | Admitting: Internal Medicine

## 2015-08-14 ENCOUNTER — Telehealth: Payer: Self-pay | Admitting: Behavioral Health

## 2015-08-14 NOTE — Telephone Encounter (Signed)
Unable to reach patient at time of Pre-Visit Call.  Left message for patient to return call when available.    

## 2015-08-15 ENCOUNTER — Encounter: Payer: Self-pay | Admitting: Internal Medicine

## 2015-08-15 ENCOUNTER — Ambulatory Visit (INDEPENDENT_AMBULATORY_CARE_PROVIDER_SITE_OTHER): Payer: BLUE CROSS/BLUE SHIELD | Admitting: Internal Medicine

## 2015-08-15 VITALS — BP 108/74 | HR 62 | Temp 98.2°F | Ht 68.5 in | Wt 189.2 lb

## 2015-08-15 DIAGNOSIS — Z0000 Encounter for general adult medical examination without abnormal findings: Secondary | ICD-10-CM

## 2015-08-15 DIAGNOSIS — Z23 Encounter for immunization: Secondary | ICD-10-CM | POA: Diagnosis not present

## 2015-08-15 DIAGNOSIS — R7303 Prediabetes: Secondary | ICD-10-CM | POA: Diagnosis not present

## 2015-08-15 DIAGNOSIS — Z114 Encounter for screening for human immunodeficiency virus [HIV]: Secondary | ICD-10-CM

## 2015-08-15 LAB — COMPLETE METABOLIC PANEL WITH GFR
ALT: 43 U/L (ref 9–46)
AST: 27 U/L (ref 10–35)
Albumin: 4.8 g/dL (ref 3.6–5.1)
Alkaline Phosphatase: 43 U/L (ref 40–115)
BILIRUBIN TOTAL: 0.8 mg/dL (ref 0.2–1.2)
BUN: 19 mg/dL (ref 7–25)
CO2: 25 mmol/L (ref 20–31)
Calcium: 9.6 mg/dL (ref 8.6–10.3)
Chloride: 103 mmol/L (ref 98–110)
Creat: 1.25 mg/dL (ref 0.70–1.33)
GFR, Est African American: 75 mL/min (ref >=60)
GFR, Est Non African American: 65 mL/min (ref >=60)
GLUCOSE: 93 mg/dL (ref 65–99)
Potassium: 4.3 mmol/L (ref 3.5–5.3)
SODIUM: 139 mmol/L (ref 135–146)
TOTAL PROTEIN: 7.1 g/dL (ref 6.1–8.1)

## 2015-08-15 LAB — LIPID PANEL
CHOL/HDL RATIO: 3
Cholesterol: 151 mg/dL (ref 0–200)
HDL: 45.4 mg/dL (ref >39.00)
LDL Cholesterol: 83 mg/dL (ref 0–99)
NONHDL: 105.76
Triglycerides: 116 mg/dL (ref 0.0–149.0)
VLDL: 23.2 mg/dL (ref 0.0–40.0)

## 2015-08-15 LAB — CBC WITH DIFFERENTIAL/PLATELET
BASOS ABS: 0 10*3/uL (ref 0.0–0.1)
Basophils Relative: 0.5 % (ref 0.0–3.0)
Eosinophils Absolute: 0.1 10*3/uL (ref 0.0–0.7)
Eosinophils Relative: 1.3 % (ref 0.0–5.0)
HEMATOCRIT: 46.4 % (ref 39.0–52.0)
Hemoglobin: 15.3 g/dL (ref 13.0–17.0)
LYMPHS ABS: 1.7 10*3/uL (ref 0.7–4.0)
LYMPHS PCT: 27.1 % (ref 12.0–46.0)
MCHC: 33 g/dL (ref 30.0–36.0)
MCV: 91.2 fl (ref 78.0–100.0)
MONOS PCT: 8.8 % (ref 3.0–12.0)
Monocytes Absolute: 0.5 10*3/uL (ref 0.1–1.0)
NEUTROS PCT: 62.3 % (ref 43.0–77.0)
Neutro Abs: 3.9 10*3/uL (ref 1.4–7.7)
Platelets: 172 10*3/uL (ref 150.0–400.0)
RBC: 5.09 Mil/uL (ref 4.22–5.81)
RDW: 14.4 % (ref 11.5–15.5)
WBC: 6.2 10*3/uL (ref 4.0–10.5)

## 2015-08-15 LAB — TSH: TSH: 1.94 u[IU]/mL (ref 0.35–4.50)

## 2015-08-15 MED ORDER — ATORVASTATIN CALCIUM 10 MG PO TABS: 10.0000 mg | ORAL_TABLET | Freq: Every day | ORAL | Status: DC

## 2015-08-15 MED ORDER — TADALAFIL 20 MG PO TABS: 10.0000 mg | ORAL_TABLET | ORAL | Status: AC | PRN

## 2015-08-15 NOTE — Patient Instructions (Signed)
BEFORE YOU LEAVE THE OFFICE:  GO TO THE LAB  Get the blood work    GO TO THE FRONT DESK Schedule a complete physical exam to be done in 1 year Please be fasting   

## 2015-08-15 NOTE — Progress Notes (Signed)
Pre visit review using our clinic review tool, if applicable. No additional management support is needed unless otherwise documented below in the visit note. 

## 2015-08-15 NOTE — Assessment & Plan Note (Addendum)
Td ~ 2007 Flu shot today Zostavax:Benefits discussed before   ++ FH CAD---> nuclear stress test-stress ECHO  (-) 2012 01-03-2013 Cscope---> 2 polyps, next 5 year  DRE and PSA normal 2015 Diet - regular  exercise-- doing great labs: CMP, FLP, CBC, TSH, HIV-- no fasting, if elevated chol will RTC

## 2015-08-15 NOTE — Progress Notes (Signed)
Subjective:    Patient ID: Gary Huff, male    DOB: August 15, 1960, 55 y.o.   MRN: [id number]  DOS:  08/15/2015 Type of visit - description : CPX Interval history:since the last visit, he had a retinal detachment,status post surgery, still recuperating    Review of Systems  Constitutional: No fever. No chills. No unexplained wt changes. No unusual sweats  HEENT: No dental problems, no ear discharge, no facial swelling, no voice changes.    Respiratory: No wheezing , no  difficulty breathing. No cough , no mucus production  Cardiovascular: No CP, no leg swelling , no  Palpitations  GI: no nausea, no vomiting, no diarrhea , no  abdominal pain.  No blood in the stools. No dysphagia, no odynophagia    Endocrine: No polyphagia, no polyuria , no polydipsia  GU: No dysuria, gross hematuria, difficulty urinating. No urinary urgency, no frequency.  Musculoskeletal: No joint swellings or unusual aches or pains  Skin: No change in the color of the skin, palor , no  Rash  Allergic, immunologic: No environmental allergies , no  food allergies  Neurological: No dizziness no  syncope. No headaches. No diplopia, no slurred, no slurred speech, no motor deficits, no facial  Numbness  Hematological: No enlarged lymph nodes, no easy bruising , no unusual bleedings  Psychiatry: No suicidal ideas, no hallucinations, no beavior problems, no confusion.  No unusual/severe anxiety, no depression   Past Medical History  Diagnosis Date  . Duodenal ulcer     twice in his 50s  . Normal stress echocardiogram 07/2010    no ischemia; normal LVF  . Dysplastic nevus 07-2010    Dr Allyson Sabal, sees derm regularly  . Diverticulitis     recurrent   . Personal history of colonic adenomas 01/11/2013  . Shingles 12/22/2013  . Retinal detachment     Past Surgical History  Procedure Laterality Date  . Eye surgery      x 2    Social History   Social History  . Marital Status: Married    Spouse Name:  N/A  . Number of Children: 3  . Years of Education: N/A   Occupational History  . [employer]    Social History Main Topics  . Smoking status: Never Smoker   . Smokeless tobacco: Never Used  . Alcohol Use: 2.4 oz/week    4 Glasses of wine per week     Comment: socially  . Drug Use: No  . Sexual Activity: Not on file   Other Topics Concern  . Not on file   Social History Narrative   Original from [place]   Married, 3 children     Works at [employer] in [place] since 2016      Family History  Problem Relation Age of Onset  . Hypertension Brother   . Diabetes Neg Hx   . Colon cancer Neg Hx   . Colon polyps Neg Hx   . Prostate cancer Neg Hx   . Rectal cancer Neg Hx   . Stomach cancer Neg Hx   . Coronary artery disease Other     GF, 3 uncles, Brother  (stent  at age 88)  . Lung cancer Father     smoker      Medication List       This list is accurate as of: 08/15/15 11:59 PM.  Always use your most recent med list.  atorvastatin 10 MG tablet  Commonly known as:  LIPITOR  Take 1 tablet (10 mg total) by mouth daily.     tadalafil 20 MG tablet  Commonly known as:  CIALIS  Take 0.5-1 tablets (10-20 mg total) by mouth every other day as needed for erectile dysfunction.     valACYclovir 1000 MG tablet  Commonly known as:  VALTREX  Reported on 08/15/2015           Objective:   Physical Exam BP 108/74 mmHg  Pulse 62  Temp(Src) 98.2 F (36.8 C) (Oral)  Ht 5' 8.5" (1.74 m)  Wt 189 lb 4 oz (85.843 kg)  BMI 28.35 kg/m2  SpO2 96% General:   Well developed, well nourished . NAD.  HEENT:  Normocephalic . Face symmetric, atraumatic Neck : No thyromegaly Lungs:  CTA B Normal respiratory effort, no intercostal retractions, no accessory muscle use. Heart: RRR,  no murmur.  no pretibial edema bilaterally  Abdomen:  Not distended, soft, non-tender. No rebound or rigidity.  Skin: Not pale. Not jaundice Neurologic:  alert & oriented X3.   Speech normal, gait appropriate for age and unassisted Psych--  Cognition and judgment appear intact.  Cooperative with normal attention span and concentration.  Behavior appropriate. No anxious or depressed appearing.     Assessment & Plan:   Assessment Prediabetes A1C 5.9  (2014) L Retinal detachment 03-2015, surgery Shingles 2015 ED-- cialis  GI: --H/ o Duodenal ulcer x 2 --H/o Diverticulitis --Colon adenomas H/o dysplastic nevus, Dr Allyson Sabal 2012 Nl stress test 2012  PLAN: Prediabetes: Check A1c Hyperlipidemia: he ran out of Lipitor a few days ago, refill sent, check labs. H/o dysplastic nevus-- rec to see derm Recurrent diverticulitis-- no sx x > 1 year, rec to be seen if sx  ED: Has been taking Cialis on and off for a while, no side effects,good results, request a refill. Sent. RTC 1 year

## 2015-08-16 LAB — HIV ANTIBODY (ROUTINE TESTING W REFLEX): HIV 1&2 Ab, 4th Generation: NONREACTIVE

## 2015-08-19 NOTE — Addendum Note (Signed)
Addended by: Ricky Ala on: 08/19/2015 02:13 PM   Modules accepted: Orders

## 2015-08-20 ENCOUNTER — Other Ambulatory Visit (INDEPENDENT_AMBULATORY_CARE_PROVIDER_SITE_OTHER): Payer: BLUE CROSS/BLUE SHIELD

## 2015-08-20 DIAGNOSIS — R739 Hyperglycemia, unspecified: Secondary | ICD-10-CM

## 2015-08-20 LAB — HEMOGLOBIN A1C: Hgb A1c MFr Bld: 5.6 % (ref 4.6–6.5)

## 2016-07-22 ENCOUNTER — Telehealth: Payer: Self-pay | Admitting: Internal Medicine

## 2016-07-22 NOTE — Telephone Encounter (Signed)
-----   Message from Colon Branch, MD sent at 07/21/2016 10:32 AM EST ----- Regarding: CPX Please make an appointment for a CPX on January 11, he knows you are calling , lives out of town and is the only day he can be seen ok to put 2 15-min slots together  His cell [phone]

## 2016-07-22 NOTE — Telephone Encounter (Signed)
Per Vilma Prader ok to use 9:30am or 11:30am slot for CPE on 07/30/16, LM to notify pt of appt change to 07/30/16 9:30am

## 2016-07-30 ENCOUNTER — Ambulatory Visit (INDEPENDENT_AMBULATORY_CARE_PROVIDER_SITE_OTHER): Payer: BLUE CROSS/BLUE SHIELD | Admitting: Internal Medicine

## 2016-07-30 ENCOUNTER — Encounter: Payer: Self-pay | Admitting: Internal Medicine

## 2016-07-30 VITALS — BP 118/78 | HR 76 | Temp 98.1°F | Resp 14 | Ht 69.0 in | Wt 192.2 lb

## 2016-07-30 DIAGNOSIS — Z0000 Encounter for general adult medical examination without abnormal findings: Secondary | ICD-10-CM

## 2016-07-30 DIAGNOSIS — Z23 Encounter for immunization: Secondary | ICD-10-CM | POA: Diagnosis not present

## 2016-07-30 DIAGNOSIS — Z09 Encounter for follow-up examination after completed treatment for conditions other than malignant neoplasm: Secondary | ICD-10-CM | POA: Insufficient documentation

## 2016-07-30 DIAGNOSIS — Z1159 Encounter for screening for other viral diseases: Secondary | ICD-10-CM

## 2016-07-30 NOTE — Assessment & Plan Note (Signed)
Prediabetes: Check A1c Dyslipidemia: Use to take Lipitor, ran out 7 months ago. Will check a FLP and consider reintroduce a statins. LDL goal 100 or less given family history. Lives in [place], if Lipitor needed he will do labs there weeks and fax results to me RTC one year

## 2016-07-30 NOTE — Progress Notes (Signed)
Subjective:    Patient ID: Gary Huff, male    DOB: 1961-06-30, 56 y.o.   MRN: [id number]  DOS:  07/30/2016 Type of visit - description : CPX Interval history: Since the last office visit, he ran out of Lipitor and has not been taking it for several months   Review of Systems Constitutional: No fever. No chills. No unexplained wt changes. No unusual sweats  HEENT: No dental problems, no ear discharge, no facial swelling, no voice changes. No eye discharge, no eye  redness , no  intolerance to light   Respiratory: No wheezing , no  difficulty breathing. No cough , no mucus production  Cardiovascular: No CP, no leg swelling , no  Palpitations  GI: no nausea, no vomiting, no diarrhea , no  abdominal pain.  No blood in the stools. No dysphagia, no odynophagia    Endocrine: No polyphagia, no polyuria , no polydipsia  GU: No dysuria, gross hematuria, difficulty urinating. No urinary urgency, no frequency.  Musculoskeletal: No joint swellings or unusual aches or pains  Skin: No change in the color of the skin, palor , no  Rash  Allergic, immunologic: No environmental allergies , no  food allergies  Neurological: No dizziness no  syncope. No headaches. No diplopia, no slurred, no slurred speech, no motor deficits, no facial  Numbness  Hematological: No enlarged lymph nodes, no easy bruising , no unusual bleedings  Psychiatry: No suicidal ideas, no hallucinations, no beavior problems, no confusion.  No unusual/severe anxiety, no depression   Past Medical History:  Diagnosis Date  . Diverticulitis    recurrent   . Duodenal ulcer    twice in his 81s  . Dysplastic nevus 07-2010   Dr Allyson Sabal, sees derm regularly  . Normal stress echocardiogram 07/2010   no ischemia; normal LVF  . Personal history of colonic adenomas 01/11/2013  . Retinal detachment   . Shingles 12/22/2013    Past Surgical History:  Procedure Laterality Date  . EYE SURGERY     x 2    Social History    Social History  . Marital status: Married    Spouse name: N/A  . Number of children: 3  . Years of education: N/A   Occupational History  . [employer] [place] History Main Topics  . Smoking status: Never Smoker  . Smokeless tobacco: Never Used  . Alcohol use 2.4 oz/week    4 Glasses of wine per week     Comment: socially  . Drug use: No  . Sexual activity: Not on file   Other Topics Concern  . Not on file   Social History Narrative   Original from [place]   Married, 3 children     Works at [employer] in [place] since 2016      Family History  Problem Relation Age of Onset  . Hypertension Brother   . CAD Brother     stent   . Coronary artery disease Other     GF, 3 uncles, Brother  (stent  at age 98)  . Lung cancer Father     smoker  . Diabetes Neg Hx   . Colon cancer Neg Hx   . Colon polyps Neg Hx   . Prostate cancer Neg Hx   . Rectal cancer Neg Hx   . Stomach cancer Neg Hx      Allergies as of 07/30/2016      Reactions   Penicillins Rash  Medication List       Accurate as of 07/30/16  4:43 PM. Always use your most recent med list.          tadalafil 20 MG tablet Commonly known as:  CIALIS Take 0.5-1 tablets (10-20 mg total) by mouth every other day as needed for erectile dysfunction.          Objective:   Physical Exam BP 118/78 (BP Location: Left Arm, Patient Position: Sitting, Cuff Size: Normal)   Pulse 76   Temp 98.1 F (36.7 C) (Oral)   Resp 14   Ht 5\' 9"  (1.753 m)   Wt 192 lb 4 oz (87.2 kg)   SpO2 99%   BMI 28.39 kg/m  General:   Well developed, well nourished . NAD.  HEENT:  Normocephalic . Face symmetric, atraumatic Lungs:  CTA B Normal respiratory effort, no intercostal retractions, no accessory muscle use. Heart: RRR,  no murmur.  No pretibial edema bilaterally  Skin: Not pale. Not jaundice Neurologic:  alert & oriented X3.  Speech normal, gait appropriate for age and unassisted Psych--   Cognition and judgment appear intact.  Cooperative with normal attention span and concentration.  Behavior appropriate. No anxious or depressed appearing.      Assessment & Plan:   Assessment Prediabetes A1C 5.9  (2014) Dyslipidemia L Retinal detachment 03-2015, surgery 2016 and 06-2016 (cataract) Shingles 2015 ED-- cialis  GI: --H/ o Duodenal ulcer x 2 --H/o Diverticulitis --Colon adenomas H/o dysplastic nevus, Dr Allyson Sabal 2012 Nl stress test 2012  PLAN: Prediabetes: Check A1c Dyslipidemia: Use to take Lipitor, ran out 7 months ago. Will check a FLP and consider reintroduce a statins. LDL goal 100 or less given family history. Lives in [place], if Lipitor needed he will do labs there weeks and fax results to me RTC one year

## 2016-07-30 NOTE — Progress Notes (Signed)
Pre visit review using our clinic review tool, if applicable. No additional management support is needed unless otherwise documented below in the visit note. 

## 2016-07-30 NOTE — Assessment & Plan Note (Addendum)
Td  07-2016 ; Flu shot today Zostavax:Benefits discussed before   ++ FH CAD---> nuclear stress test-stress ECHO  (-) 2012. Plan is to control CV RFs . Specifically I  like to see his LDL 100 or less. 01-03-2013 Cscope---> 2 polyps, next 5 year  DRE and PSA normal 2015. Recheck next year Diet: Eats healthy when he is at home but he travels a lot. Counseled exercise-- usually very active, had eye surgery few weeks ago, inactive lately. labs: Will come back fasting CMP, FLP, CBC, A1c, hep C.

## 2016-07-30 NOTE — Patient Instructions (Addendum)
    GO TO THE FRONT DESK Schedule labs to be done tomorrow, fasting  Schedule your next appointment for a  physical exam in one year  

## 2016-07-31 ENCOUNTER — Other Ambulatory Visit (INDEPENDENT_AMBULATORY_CARE_PROVIDER_SITE_OTHER): Payer: BLUE CROSS/BLUE SHIELD

## 2016-07-31 DIAGNOSIS — Z0000 Encounter for general adult medical examination without abnormal findings: Secondary | ICD-10-CM

## 2016-07-31 DIAGNOSIS — Z1159 Encounter for screening for other viral diseases: Secondary | ICD-10-CM

## 2016-07-31 LAB — COMPREHENSIVE METABOLIC PANEL
ALK PHOS: 43 U/L (ref 39–117)
ALT: 31 U/L (ref 0–53)
AST: 21 U/L (ref 0–37)
Albumin: 4.7 g/dL (ref 3.5–5.2)
BUN: 19 mg/dL (ref 6–23)
CHLORIDE: 104 meq/L (ref 96–112)
CO2: 26 mEq/L (ref 19–32)
CREATININE: 1.14 mg/dL (ref 0.40–1.50)
Calcium: 9.6 mg/dL (ref 8.4–10.5)
GFR: 70.85 mL/min (ref >60.00)
GLUCOSE: 103 mg/dL — AB (ref 70–99)
POTASSIUM: 3.7 meq/L (ref 3.5–5.1)
SODIUM: 138 meq/L (ref 135–145)
TOTAL PROTEIN: 7.1 g/dL (ref 6.0–8.3)
Total Bilirubin: 1 mg/dL (ref 0.2–1.2)

## 2016-07-31 LAB — CBC WITH DIFFERENTIAL/PLATELET
BASOS PCT: 0.6 % (ref 0.0–3.0)
Basophils Absolute: 0 10*3/uL (ref 0.0–0.1)
EOS ABS: 0.1 10*3/uL (ref 0.0–0.7)
Eosinophils Relative: 2 % (ref 0.0–5.0)
HEMATOCRIT: 44.9 % (ref 39.0–52.0)
HEMOGLOBIN: 15.3 g/dL (ref 13.0–17.0)
LYMPHS PCT: 18.4 % (ref 12.0–46.0)
Lymphs Abs: 1.2 10*3/uL (ref 0.7–4.0)
MCHC: 34 g/dL (ref 30.0–36.0)
MCV: 89.6 fl (ref 78.0–100.0)
MONO ABS: 0.5 10*3/uL (ref 0.1–1.0)
Monocytes Relative: 8.3 % (ref 3.0–12.0)
Neutro Abs: 4.4 10*3/uL (ref 1.4–7.7)
Neutrophils Relative %: 70.7 % (ref 43.0–77.0)
Platelets: 145 10*3/uL — ABNORMAL LOW (ref 150.0–400.0)
RBC: 5.01 Mil/uL (ref 4.22–5.81)
RDW: 14 % (ref 11.5–15.5)
WBC: 6.3 10*3/uL (ref 4.0–10.5)

## 2016-07-31 LAB — LIPID PANEL
CHOLESTEROL: 175 mg/dL (ref 0–200)
HDL: 46.1 mg/dL (ref >39.00)
LDL CALC: 105 mg/dL — AB (ref 0–99)
NONHDL: 129.25
Total CHOL/HDL Ratio: 4
Triglycerides: 120 mg/dL (ref 0.0–149.0)
VLDL: 24 mg/dL (ref 0.0–40.0)

## 2016-07-31 LAB — HEPATITIS C ANTIBODY: HCV AB: NEGATIVE

## 2016-07-31 LAB — HEMOGLOBIN A1C: HEMOGLOBIN A1C: 5.6 % (ref 4.6–6.5)

## 2016-08-04 MED ORDER — ATORVASTATIN CALCIUM 10 MG PO TABS: 10.0000 mg | ORAL_TABLET | Freq: Every day | ORAL | 1 refills | Status: DC

## 2016-08-19 ENCOUNTER — Encounter: Payer: BLUE CROSS/BLUE SHIELD | Admitting: Internal Medicine

## 2017-02-05 ENCOUNTER — Telehealth: Payer: Self-pay | Admitting: *Deleted

## 2017-02-05 NOTE — Telephone Encounter (Signed)
Received Health Care Provider Form - [employer] [patient employer], completed as much as possible; forwarded to provider/SLS 07/20

## 2017-05-06 ENCOUNTER — Other Ambulatory Visit: Payer: Self-pay | Admitting: Internal Medicine

## 2018-03-07 ENCOUNTER — Encounter: Payer: Self-pay | Admitting: Internal Medicine
